# Patient Record
Sex: Female | Born: 1985 | Race: Asian | Hispanic: No | Marital: Married | Smoking: Never smoker
Health system: Southern US, Community
[De-identification: ages and names within clinical notes are randomized; demographics above are authoritative.]

## PROBLEM LIST (undated history)

## (undated) DIAGNOSIS — I10 Essential (primary) hypertension: Secondary | ICD-10-CM

## (undated) DIAGNOSIS — Z8632 Personal history of gestational diabetes: Secondary | ICD-10-CM

## (undated) DIAGNOSIS — Z8751 Personal history of pre-term labor: Secondary | ICD-10-CM

## (undated) DIAGNOSIS — O24419 Gestational diabetes mellitus in pregnancy, unspecified control: Secondary | ICD-10-CM

## (undated) HISTORY — DX: Personal history of pre-term labor: Z87.51

## (undated) HISTORY — DX: Personal history of gestational diabetes: Z86.32

---

## 2009-07-17 ENCOUNTER — Ambulatory Visit (HOSPITAL_COMMUNITY): Admission: RE | Admit: 2009-07-17 | Discharge: 2009-07-17 | Payer: Self-pay | Admitting: Obstetrics & Gynecology

## 2009-08-03 ENCOUNTER — Ambulatory Visit: Payer: Self-pay | Admitting: Obstetrics & Gynecology

## 2009-08-06 ENCOUNTER — Inpatient Hospital Stay (HOSPITAL_COMMUNITY): Admission: AD | Admit: 2009-08-06 | Discharge: 2009-08-08 | Payer: Self-pay | Admitting: Family Medicine

## 2009-08-06 ENCOUNTER — Ambulatory Visit: Payer: Self-pay | Admitting: Obstetrics and Gynecology

## 2010-11-18 LAB — CBC
HCT: 34.7 % — ABNORMAL LOW (ref 36.0–46.0)
MCHC: 30.8 g/dL (ref 30.0–36.0)
MCV: 75.7 fL — ABNORMAL LOW (ref 78.0–100.0)
Platelets: 166 10*3/uL (ref 150–400)
RDW: 14.3 % (ref 11.5–15.5)

## 2014-01-30 ENCOUNTER — Encounter (HOSPITAL_COMMUNITY): Payer: Self-pay | Admitting: Emergency Medicine

## 2014-01-30 ENCOUNTER — Emergency Department (INDEPENDENT_AMBULATORY_CARE_PROVIDER_SITE_OTHER)
Admission: EM | Admit: 2014-01-30 | Discharge: 2014-01-30 | Disposition: A | Discharge status: Home / Self Care | Payer: BC Managed Care – PPO | Source: Home / Self Care | Attending: Emergency Medicine | Admitting: Emergency Medicine

## 2014-01-30 DIAGNOSIS — H81399 Other peripheral vertigo, unspecified ear: Secondary | ICD-10-CM

## 2014-01-30 DIAGNOSIS — H6120 Impacted cerumen, unspecified ear: Secondary | ICD-10-CM

## 2014-01-30 DIAGNOSIS — J329 Chronic sinusitis, unspecified: Secondary | ICD-10-CM

## 2014-01-30 HISTORY — DX: Essential (primary) hypertension: I10

## 2014-01-30 LAB — POCT URINALYSIS DIP (DEVICE)
Bilirubin Urine: NEGATIVE
GLUCOSE, UA: NEGATIVE mg/dL
Hgb urine dipstick: NEGATIVE
Ketones, ur: NEGATIVE mg/dL
LEUKOCYTES UA: NEGATIVE
NITRITE: NEGATIVE
Protein, ur: NEGATIVE mg/dL
Specific Gravity, Urine: 1.025 (ref 1.005–1.030)
UROBILINOGEN UA: 0.2 mg/dL (ref 0.0–1.0)
pH: 6 (ref 5.0–8.0)

## 2014-01-30 LAB — POCT I-STAT, CHEM 8
BUN: 6 mg/dL (ref 6–23)
CALCIUM ION: 1.25 mmol/L — AB (ref 1.12–1.23)
CHLORIDE: 102 meq/L (ref 96–112)
Creatinine, Ser: 0.7 mg/dL (ref 0.50–1.10)
Glucose, Bld: 78 mg/dL (ref 70–99)
HEMATOCRIT: 44 % (ref 36.0–46.0)
HEMOGLOBIN: 15 g/dL (ref 12.0–15.0)
Potassium: 4.3 mEq/L (ref 3.7–5.3)
Sodium: 143 mEq/L (ref 137–147)
TCO2: 24 mmol/L (ref 0–100)

## 2014-01-30 LAB — POCT PREGNANCY, URINE: Preg Test, Ur: NEGATIVE

## 2014-01-30 MED ORDER — HYDROCORTISONE-ACETIC ACID 1-2 % OT SOLN: 3.0000 [drp] | OTIC | Status: DC

## 2014-01-30 MED ORDER — MECLIZINE HCL 25 MG PO TABS: 25.0000 mg | ORAL_TABLET | Freq: Four times a day (QID) | ORAL | Status: DC

## 2014-01-30 MED ORDER — DOCUSATE SODIUM 50 MG/5ML PO LIQD
ORAL | Status: AC
  Filled 2014-01-30: qty 10

## 2014-01-30 MED ORDER — AMOXICILLIN-POT CLAVULANATE 875-125 MG PO TABS: 1.0000 | ORAL_TABLET | Freq: Two times a day (BID) | ORAL | Status: DC

## 2014-01-30 NOTE — ED Notes (Signed)
Patient given Colace in both ears due to impacted cerumen.

## 2014-01-30 NOTE — Discharge Instructions (Signed)
Cerumen Plug A cerumen plug is having too much wax in your ear canal. The outer ear canal is lined with hairs and glands that secrete wax. This wax is called cerumen. This protects the ear canal. It also helps prevent material from entering the ear. Too much wax can cause a feeling of fullness in the ears, decreased hearing, ringing in the ears, or an earache. Sometimes your caregiver will remove a cerumen plug with an instrument called a curette. Or he/she may flush the ear canal with warm water from a syringe to remove the wax. You may simply be sent home to follow the home care instructions below for wax removal. Generally ear wax does not have to be removed unless it is causing a problem such as one of those listed above. When too much wax is causing a problem, the following are a few home remedies which can be used to help this problem. HOME CARE INSTRUCTIONS   Put a couple drops of glycerin, baby oil, or mineral oil in the ear a couple times of day. Do this every day for several days. After putting the drops in, you will need to lay with the affected ear pointing up for a couple minutes. This allows the drops to remain in the canal and run down to the area of wax blockage. This will soften the wax plug. It may also make your hearing worse as the wax softens and blocks the canal even more.  After a couple days, you may gently flush the ear canal with warm water from a syringe. Do this by pulling your ear up and back with your head tilted slightly forward and towards a pan to catch the water. This is most easily done with a helper. You can also accomplish the same thing by letting the shower beat into your ear canal to wash the wax out. Sometimes this will not be immediately successful. You will have to return to the first step of using the oil to further soften the wax. Then resume washing the ear canal out with a syringe or shower.  Following removal of the wax, put ten to twenty drops of rubbing  alcohol into the outer ears. This will dry the canal and prevent an infection.  Do not irrigate or wash out your ears if you have had a perforated ear drum or mastoid surgery. SEEK IMMEDIATE MEDICAL CARE IF:   You are unsuccessful with the above instructions for home care.  You develop ear pain or drainage from the ear. MAKE SURE YOU:   Understand these instructions.  Will watch your condition.  Will get help right away if you are not doing well or get worse. Document Released: 04/29/2001 Document Revised: 10/27/2011 Document Reviewed: 07/26/2008 [facility] Patient Information 2014 [place], [place].  Dizziness Dizziness is a common problem. It is a feeling of unsteadiness or lightheadedness. You may feel like you are about to faint. Dizziness can lead to injury if you stumble or fall. A person of any age group can suffer from dizziness, but dizziness is more common in older adults. CAUSES  Dizziness can be caused by many different things, including:  Middle ear problems.  Standing for too long.  Infections.  An allergic reaction.  Aging.  An emotional response to something, such as the sight of blood.  Side effects of medicines.  Fatigue.  Problems with circulation or blood pressure.  Excess use of alcohol, medicines, or illegal drug use.  Breathing too fast (hyperventilation).  An arrhythmia  or problems with your heart rhythm.  Low red blood cell count (anemia).  Pregnancy.  Vomiting, diarrhea, fever, or other illnesses that cause dehydration.  Diseases or conditions such as Parkinson's disease, high blood pressure (hypertension), diabetes, and thyroid problems.  Exposure to extreme heat. DIAGNOSIS  To find the cause of your dizziness, your caregiver may do a physical exam, lab tests, radiologic imaging scans, or an electrocardiography test (ECG).  TREATMENT  Treatment of dizziness depends on the cause of your symptoms and can vary greatly. HOME CARE  INSTRUCTIONS   Drink enough fluids to keep your urine clear or pale yellow. This is especially important in very hot weather. In the elderly, it is also important in cold weather.  If your dizziness is caused by medicines, take them exactly as directed. When taking blood pressure medicines, it is especially important to get up slowly.  Rise slowly from chairs and steady yourself until you feel okay.  In the morning, first sit up on the side of the bed. When this seems okay, stand slowly while holding onto something until you know your balance is fine.  If you need to stand in one place for a long time, be sure to move your legs often. Tighten and relax the muscles in your legs while standing.  If dizziness continues to be a problem, have someone stay with you for a day or two. Do this until you feel you are well enough to stay alone. Have the person call your caregiver if he or she notices changes in you that are concerning.  Do not drive or use heavy machinery if you feel dizzy.  Do not drink alcohol. SEEK IMMEDIATE MEDICAL CARE IF:   Your dizziness or lightheadedness gets worse.  You feel nauseous or vomit.  You develop problems with talking, walking, weakness, or using your arms, hands, or legs.  You are not thinking clearly or you have difficulty forming sentences. It may take a friend or family member to determine if your thinking is normal.  You develop chest pain, abdominal pain, shortness of breath, or sweating.  Your vision changes.  You notice any bleeding.  You have side effects from medicine that seems to be getting worse rather than better. MAKE SURE YOU:   Understand these instructions.  Will watch your condition.  Will get help right away if you are not doing well or get worse. Document Released: 01/28/2001 Document Revised: 10/27/2011 Document Reviewed: 02/21/2011 [facility] Patient Information 2014 [place], [place].  Benign Positional Vertigo Vertigo means  you feel like you or your surroundings are moving when they are not. Benign positional vertigo is the most common form of vertigo. Benign means that the cause of your condition is not serious. Benign positional vertigo is more common in older adults. CAUSES  Benign positional vertigo is the result of an upset in the labyrinth system. This is an area in the middle ear that helps control your balance. This may be caused by a viral infection, head injury, or repetitive motion. However, often no specific cause is found. SYMPTOMS  Symptoms of benign positional vertigo occur when you move your head or eyes in different directions. Some of the symptoms may include:  Loss of balance and falls.  Vomiting.  Blurred vision.  Dizziness.  Nausea.  Involuntary eye movements (nystagmus). DIAGNOSIS  Benign positional vertigo is usually diagnosed by physical exam. If the specific cause of your benign positional vertigo is unknown, your caregiver may perform imaging tests, such as magnetic  resonance imaging (MRI) or computed tomography (CT). TREATMENT  Your caregiver may recommend movements or procedures to correct the benign positional vertigo. Medicines such as meclizine, benzodiazepines, and medicines for nausea may be used to treat your symptoms. In rare cases, if your symptoms are caused by certain conditions that affect the inner ear, you may need surgery. HOME CARE INSTRUCTIONS   Follow your caregiver's instructions.  Move slowly. Do not make sudden body or head movements.  Avoid driving.  Avoid operating heavy machinery.  Avoid performing any tasks that would be dangerous to you or others during a vertigo episode.  Drink enough fluids to keep your urine clear or pale yellow. SEEK IMMEDIATE MEDICAL CARE IF:   You develop problems with walking, weakness, numbness, or using your arms, hands, or legs.  You have difficulty speaking.  You develop severe headaches.  Your nausea or vomiting  continues or gets worse.  You develop visual changes.  Your family or friends notice any behavioral changes.  Your condition gets worse.  You have a fever.  You develop a stiff neck or sensitivity to light. MAKE SURE YOU:   Understand these instructions.  Will watch your condition.  Will get help right away if you are not doing well or get worse. Document Released: 05/12/2006 Document Revised: 10/27/2011 Document Reviewed: 04/24/2011 [facility]Exit[facility] Patient Information 2014 [place], [place].

## 2014-01-30 NOTE — ED Provider Notes (Signed)
CSN: [id number]     Arrival date & time 01/30/14  1415 History   First MD Initiated Contact with Patient 01/30/14 1559     Chief Complaint  Patient presents with  . Dizziness   (Consider location/radiation/quality/duration/timing/severity/associated sxs/prior Treatment) HPI Comments: 28 year old female presents complaining of dizziness. She describes this as feeling like the world is spinning around her and like she is nauseous and has a headache. The feeling was bad this morning but it has gotten significantly better throughout the day. She had this once before in the past but was not near this bad. Her headache is located primarily on the left side in her forehead and cheekbones. She denies fever, chills home on vomiting, diarrhea, recent travel, sick contacts. Denies palpitations or feeling like she will faint. Denies any history of heart problems. She has never been seen for this problem the past  Patient is a 28 y.o. female presenting with dizziness.  Dizziness Associated symptoms: headaches and nausea   Associated symptoms: no chest pain, no diarrhea, no shortness of breath and no vomiting     Past Medical History  Diagnosis Date  . Hypertension    History reviewed. No pertinent past surgical history. No family history on file. History  Substance Use Topics  . Smoking status: Never Smoker   . Smokeless tobacco: Not on file  . Alcohol Use: No   OB History   Grav Para Term Preterm Abortions TAB SAB Ect Mult Living                 Review of Systems  Constitutional: Negative for fever and chills.  HENT: Positive for sinus pressure.   Eyes: Negative for photophobia and visual disturbance.  Respiratory: Negative for cough and shortness of breath.   Cardiovascular: Negative for chest pain and leg swelling.  Gastrointestinal: Positive for nausea. Negative for vomiting, abdominal pain and diarrhea.  Genitourinary: Negative for dysuria and urgency.  Musculoskeletal: Negative for  neck stiffness.  Skin: Negative for rash.  Neurological: Positive for dizziness and headaches. Negative for numbness.  Hematological: Negative for adenopathy.  All other systems reviewed and are negative.   Allergies  Review of patient's allergies indicates no known allergies.  Home Medications   Prior to Admission medications   Medication Sig Start Date End Date Taking? Authorizing Provider  acetic acid-hydrocortisone (VOSOL-HC) otic solution Place 3 drops into both ears every 4 (four) hours. 01/30/14   Graylon GoodZachary H Alphonso Gregson, PA-C  amoxicillin-clavulanate (AUGMENTIN) 875-125 MG per tablet Take 1 tablet by mouth every 12 (twelve) hours. 01/30/14   Graylon GoodZachary H Manolito Jurewicz, PA-C  meclizine (ANTIVERT) 25 MG tablet Take 1 tablet (25 mg total) by mouth 4 (four) times daily. 01/30/14   Adrian BlackwaterZachary H Delynda Sepulveda, PA-C   BP 135/85  Pulse 81  Temp(Src) 98 F (36.7 C) (Oral)  Resp 16  SpO2 100% Physical Exam  Nursing note and vitals reviewed. Constitutional: She is oriented to person, place, and time. Vital signs are normal. She appears well-developed and well-nourished. No distress.  HENT:  Head: Normocephalic and atraumatic.  Nose: Right sinus exhibits no maxillary sinus tenderness and no frontal sinus tenderness. Left sinus exhibits maxillary sinus tenderness and frontal sinus tenderness.  Mouth/Throat: Uvula is midline, oropharynx is clear and moist and mucous membranes are normal. No oropharyngeal exudate.  Left tonsil is enlarged and mildly erythematous. There is cerumen impaction bilaterally  Eyes: Conjunctivae are normal. Right eye exhibits no discharge. Left eye exhibits no discharge.  Neck: Normal range of motion.  Neck supple.  Cardiovascular: Normal rate, regular rhythm and normal heart sounds.  Exam reveals no gallop and no friction rub.   No murmur heard. Pulmonary/Chest: Effort normal and breath sounds normal. No respiratory distress. She has no wheezes. She has no rales.  Abdominal: Soft. There is no  tenderness.  Lymphadenopathy:    She has no cervical adenopathy.  Neurological: She is alert and oriented to person, place, and time. She has normal strength. Coordination normal.  Skin: Skin is warm and dry. No rash noted. She is not diaphoretic.  Psychiatric: She has a normal mood and affect. Judgment normal.    ED Course  Procedures (including critical care time) Labs Review Labs Reviewed  POCT I-STAT, CHEM 8 - Abnormal; Notable for the following:    Calcium, Ion 1.25 (*)    All other components within normal limits  POCT PREGNANCY, URINE  POCT URINALYSIS DIP (DEVICE)    Imaging Review No results found.  EKG shows possible right axis deviation, otherwise normal  Patient is very mildly orthostatic I-STAT is normal Urinalysis and urine pregnancy test are negative The right ear was able to be rinsed out, the tympanic membrane underlying was very erythematous, but the left could not be fully rinsed  MDM   1. Peripheral vertigo   2. Cerumen impaction   3. Sinusitis    I believe this patient has a feeling of vertigo due to a sinus infection and pressure in her ears. I will prescribe her and ear drops to use for one week and she will return, we will try to rinse out the cerumen again. Followup earlier if worsening   Meds ordered this encounter  Medications  . amoxicillin-clavulanate (AUGMENTIN) 875-125 MG per tablet    Sig: Take 1 tablet by mouth every 12 (twelve) hours.    Dispense:  14 tablet    Refill:  0    Order Specific Question:  Supervising Provider    Answer:  Linna HoffKINDL, JAMES D [phone][[id number]]  . meclizine (ANTIVERT) 25 MG tablet    Sig: Take 1 tablet (25 mg total) by mouth 4 (four) times daily.    Dispense:  28 tablet    Refill:  0    Order Specific Question:  Supervising Provider    Answer:  Linna HoffKINDL, JAMES D [phone][[id number]]  . acetic acid-hydrocortisone (VOSOL-HC) otic solution    Sig: Place 3 drops into both ears every 4 (four) hours.    Dispense:  10 mL    Refill:  1    Order  Specific Question:  Supervising Provider    Answer:  Bradd CanaryKINDL, JAMES D [[id number]]       Graylon GoodZachary H Sherrin Stahle, PA-C 01/30/14 1901

## 2014-01-30 NOTE — ED Notes (Signed)
Patient c/o dizziness onset this morning around 10 am. Has not eaten anything today. This has happened one time before. Patient reports hx of high blood pressure but no longer takes medication. Patient is alert and oriented and in no acute distress.

## 2014-01-31 NOTE — ED Provider Notes (Signed)
Medical screening examination/treatment/procedure(s) were performed by resident physician or non-physician practitioner and as supervising physician I was immediately available for consultation/collaboration.   Prestyn Stanco DOUGLAS MD.   [place] Want D Roney Youtz, MD 01/31/14 1716 

## 2014-02-13 ENCOUNTER — Emergency Department (HOSPITAL_COMMUNITY)
Admission: EM | Admit: 2014-02-13 | Discharge: 2014-02-13 | Disposition: A | Discharge status: Home / Self Care | Payer: BC Managed Care – PPO | Source: Home / Self Care | Attending: Family Medicine | Admitting: Family Medicine

## 2014-02-13 ENCOUNTER — Encounter (HOSPITAL_COMMUNITY): Payer: Self-pay | Admitting: Emergency Medicine

## 2014-02-13 DIAGNOSIS — H6120 Impacted cerumen, unspecified ear: Secondary | ICD-10-CM

## 2014-02-13 DIAGNOSIS — H6122 Impacted cerumen, left ear: Secondary | ICD-10-CM

## 2014-02-13 DIAGNOSIS — K120 Recurrent oral aphthae: Secondary | ICD-10-CM

## 2014-02-13 MED ORDER — TRIAMCINOLONE ACETONIDE 0.1 % MT PSTE: 1.0000 "application " | PASTE | Freq: Two times a day (BID) | OROMUCOSAL | Status: DC

## 2014-02-13 NOTE — Discharge Instructions (Signed)
Thank you for coming in today.   Cerumen Impaction A cerumen impaction is when the wax in your ear forms a plug. This plug usually causes reduced hearing. Sometimes it also causes an earache or dizziness. Removing a cerumen impaction can be difficult and painful. The wax sticks to the ear canal. The canal is sensitive and bleeds easily. If you try to remove a heavy wax buildup with a cotton tipped swab, you may push it in further. Irrigation with water, suction, and small ear curettes may be used to clear out the wax. If the impaction is fixed to the skin in the ear canal, ear drops may be needed for a few days to loosen the wax. People who build up a lot of wax frequently can use ear wax removal products available in your local drugstore. SEEK MEDICAL CARE IF:  You develop an earache, increased hearing loss, or marked dizziness. Document Released: 09/11/2004 Document Revised: 10/27/2011 Document Reviewed: 11/01/2009 [facility] SurgeryExit[facility] Patient Information 2015 [place]ExitCare, [place]. This information is not intended to replace advice given to you by your health care provider. Make sure you discuss any questions you have with your health care provider.  Canker Sores  Canker sores are painful, open sores on the inside of the mouth and cheek. They may be white or yellow. The sores usually heal in 1 to 2 weeks. Women are more likely than men to have recurrent canker sores. CAUSES The cause of canker sores is not well understood. More than one cause is likely. Canker sores do not appear to be caused by certain types of germs (viruses or bacteria). Canker sores may be caused by:  An allergic reaction to certain foods.  Digestive problems.  Not having enough vitamin B12, folic acid, and iron.  Female sex hormones. Sores may come only during certain phases of a menstrual cycle. Often, there is improvement during pregnancy.  Genetics. Some people seem to inherit canker sore problems. Emotional stress and injuries to  the mouth may trigger outbreaks, but not cause them.  DIAGNOSIS Canker sores are diagnosed by exam.  TREATMENT  Patients who have frequent bouts of canker sores may have cultures taken of the sores, blood tests, or allergy tests. This helps determine if their sores are caused by a poor diet, an allergy, or some other preventable or treatable disease.  Vitamins may prevent recurrences or reduce the severity of canker sores in people with poor nutrition.  Numbing ointments can relieve pain. These are available in drug stores without a prescription.  Anti-inflammatory steroid mouth rinses or gels may be prescribed by your caregiver for severe sores.  Oral steroids may be prescribed if you have severe, recurrent canker sores. These strong medicines can cause many side effects and should be used only under the close direction of a dentist or physician.  Mouth rinses containing the antibiotic medicine may be prescribed. They may lessen symptoms and speed healing. Healing usually happens in about 1 or 2 weeks with or without treatment. Certain antibiotic mouth rinses given to pregnant women and young children can permanently stain teeth. Talk to your caregiver about your treatment. HOME CARE INSTRUCTIONS   Avoid foods that cause canker sores for you.  Avoid citrus juices, spicy or salty foods, and coffee until the sores are healed.  Use a soft-bristled toothbrush.  Chew your food carefully to avoid biting your cheek.  Apply topical numbing medicine to the sore to help relieve pain.  Apply a thin paste of baking soda and water  to the sore to help heal the sore.  Only use mouth rinses or medicines for pain or discomfort as directed by your caregiver. SEEK MEDICAL CARE IF:   Your symptoms are not better in 1 week.  Your sores are still present after 2 weeks.  Your sores are very painful.  You have trouble breathing or swallowing.  Your sores come back frequently. Document Released:  11/29/2010 Document Revised: 11/29/2012 Document Reviewed: 11/29/2010 [facility]Exit[facility] Patient Information 2015 [place], [place]. This information is not intended to replace advice given to you by your health care provider. Make sure you discuss any questions you have with your health care provider.

## 2014-02-13 NOTE — ED Notes (Addendum)
Via sister, Estanislado SpireJarai interpreter... Pt c/o left ear pain onset 1 week Seen on 6/15 for similar sx Also c/o lesions inside mouth??? Alert w/no signs of acute distress.

## 2014-02-13 NOTE — ED Provider Notes (Signed)
Haley Griffin is a 28 y.o. female who presents to Urgent Care today for left ear pain. Patient is in one week history of left ear pain and pressure. She's frequent episodes of cerumen impaction. She denies any fevers or chills.  Additionally patient notes a sore on the inside of her upper left lip. This is been present for 3 days and is moderately painful. No fevers or chills nausea vomiting or diarrhea.   Past Medical History  Diagnosis Date  . Hypertension    History  Substance Use Topics  . Smoking status: Never Smoker   . Smokeless tobacco: Not on file  . Alcohol Use: No   ROS as above Medications: No current facility-administered medications for this encounter.   Current Outpatient Prescriptions  Medication Sig Dispense Refill  . acetic acid-hydrocortisone (VOSOL-HC) otic solution Place 3 drops into both ears every 4 (four) hours.  10 mL  1  . amoxicillin-clavulanate (AUGMENTIN) 875-125 MG per tablet Take 1 tablet by mouth every 12 (twelve) hours.  14 tablet  0  . meclizine (ANTIVERT) 25 MG tablet Take 1 tablet (25 mg total) by mouth 4 (four) times daily.  28 tablet  0    Exam:  BP 140/96  Pulse 88  Temp(Src) 98.4 F (36.9 C) (Oral)  Resp 18  SpO2 100% Gen: Well NAD HEENT: EOMI,  MMM left tympanic membranes occluded by cerumen. Right is normal. Erythematous area with central punched out lesion left upper left consistent with aphthous ulcer. Lungs: Normal work of breathing. CTABL Heart: RRR no MRG Abd: NABS, Soft. NT, ND Exts: Brisk capillary refill, warm and well perfused.   The cerumen was removed from the left ear. Patient felt much better. The ear canal and tympanic membrane were mildly erythematous without effusion.   No results found for this or any previous visit (from the past 24 hour(s)). No results found.  Assessment and Plan: 28 y.o. female with  1) left ear cerumen impaction. 2) canker sore. Treatment with triamcinolone in [place].   Discussed warning signs  or symptoms. Please see discharge instructions. Patient expresses understanding.    Rodolph BongEvan S Corey, MD 02/13/14 [phone]

## 2014-05-29 ENCOUNTER — Inpatient Hospital Stay (HOSPITAL_COMMUNITY)
Admission: AD | Admit: 2014-05-29 | Discharge: 2014-05-29 | Disposition: A | Discharge status: Home / Self Care | Payer: BC Managed Care – PPO | Source: Ambulatory Visit | Attending: Family Medicine | Admitting: Family Medicine

## 2016-11-03 DIAGNOSIS — Z3482 Encounter for supervision of other normal pregnancy, second trimester: Secondary | ICD-10-CM | POA: Diagnosis not present

## 2016-11-03 LAB — OB RESULTS CONSOLE HGB/HCT, BLOOD
HCT: 34 %
HEMOGLOBIN: 10.3 g/dL

## 2016-11-03 LAB — OB RESULTS CONSOLE GC/CHLAMYDIA
Chlamydia: NEGATIVE
GC PROBE AMP, GENITAL: NEGATIVE

## 2016-11-03 LAB — OB RESULTS CONSOLE HIV ANTIBODY (ROUTINE TESTING): HIV: NONREACTIVE

## 2016-11-03 LAB — OB RESULTS CONSOLE PLATELET COUNT: Platelets: 227 10*3/uL

## 2016-11-03 LAB — OB RESULTS CONSOLE ABO/RH
ABO/RH(D): A POS
RH Type: POSITIVE

## 2016-11-03 LAB — OB RESULTS CONSOLE VARICELLA ZOSTER ANTIBODY, IGG: VARICELLA IGG: IMMUNE

## 2016-11-03 LAB — OB RESULTS CONSOLE ANTIBODY SCREEN: ANTIBODY SCREEN: NEGATIVE

## 2016-11-03 LAB — OB RESULTS CONSOLE RPR: RPR: NONREACTIVE

## 2016-11-03 LAB — SICKLE CELL SCREEN: Sickle Cell Screen: NORMAL

## 2016-11-03 LAB — OB RESULTS CONSOLE HEPATITIS B SURFACE ANTIGEN: Hepatitis B Surface Ag: NEGATIVE

## 2016-11-07 ENCOUNTER — Other Ambulatory Visit (HOSPITAL_COMMUNITY): Payer: Self-pay | Admitting: Nurse Practitioner

## 2016-11-07 DIAGNOSIS — O09292 Supervision of pregnancy with other poor reproductive or obstetric history, second trimester: Secondary | ICD-10-CM

## 2016-11-10 ENCOUNTER — Encounter: Payer: Medicaid Other | Attending: Obstetrics & Gynecology | Admitting: *Deleted

## 2016-11-10 ENCOUNTER — Ambulatory Visit: Payer: Medicaid Other | Admitting: *Deleted

## 2016-11-10 ENCOUNTER — Encounter: Payer: Self-pay | Admitting: *Deleted

## 2016-11-10 DIAGNOSIS — Z713 Dietary counseling and surveillance: Secondary | ICD-10-CM | POA: Insufficient documentation

## 2016-11-10 DIAGNOSIS — O24419 Gestational diabetes mellitus in pregnancy, unspecified control: Secondary | ICD-10-CM | POA: Insufficient documentation

## 2016-11-10 MED ORDER — GLUCOSE BLOOD VI STRP: ORAL_STRIP | 12 refills | Status: DC

## 2016-11-10 MED ORDER — ACCU-CHEK GUIDE W/DEVICE KIT
1.0000 | PACK | Freq: Once | 0 refills | Status: AC
Start: 2016-11-10 — End: 2016-11-10

## 2016-11-10 MED ORDER — ACCU-CHEK FASTCLIX LANCETS MISC: 1.0000 | Freq: Four times a day (QID) | 8 refills | Status: DC

## 2016-11-10 NOTE — Progress Notes (Signed)
  Patient was seen on 11/10/2016 for Gestational Diabetes self-management. Live [place] interpretor here too. Patient states no previous history of GDM.  The following learning objectives were met by the patient :   States the definition of Gestational Diabetes  States why dietary management is important in controlling blood glucose  Describes the effects of carbohydrates on blood glucose levels  Demonstrates ability to create a balanced meal plan  Demonstrates carbohydrate counting   States when to check blood glucose levels  Demonstrates proper blood glucose monitoring techniques  States the effect of stress and exercise on blood glucose levels  States the importance of limiting caffeine and abstaining from alcohol and smoking  Plan:  Aim for 3 Carb Choices per meal (45 grams) +/- 1 either way  Aim for 1-2 Carbs per snack Begin reading food labels for Total Carbohydrate of foods Consider  increasing your activity level by walking or other activity daily as tolerated Begin checking BG before breakfast and 2 hours after first bite of breakfast, lunch and dinner as directed by MD  Take medication if directed by MD  Blood glucose monitor Rx called into pharmacy: Accu Chek Guide with Fast Clix drums Patient instructed to test pre breakfast and 2 hours each meal as directed by MD Bring Log Book to every medical appointment   Patient instructed to monitor glucose levels: FBS: 60 - <90 2 hour: <120  Patient received the following handouts: In [place], 2 dialects provided  Nutrition Diabetes and Pregnancy  Carbohydrate Counting List  Patient will be seen for follow-up as needed.

## 2016-11-13 ENCOUNTER — Encounter (HOSPITAL_COMMUNITY): Payer: Self-pay | Admitting: *Deleted

## 2016-11-14 ENCOUNTER — Other Ambulatory Visit (HOSPITAL_COMMUNITY): Payer: Self-pay | Admitting: Nurse Practitioner

## 2016-11-14 ENCOUNTER — Ambulatory Visit (HOSPITAL_COMMUNITY)
Admission: RE | Admit: 2016-11-14 | Discharge: 2016-11-14 | Disposition: A | Discharge status: Home / Self Care | Payer: Medicaid Other | Source: Ambulatory Visit | Attending: Nurse Practitioner | Admitting: Nurse Practitioner

## 2016-11-14 ENCOUNTER — Encounter (HOSPITAL_COMMUNITY): Payer: Self-pay

## 2016-11-14 DIAGNOSIS — Z3A21 21 weeks gestation of pregnancy: Secondary | ICD-10-CM

## 2016-11-14 DIAGNOSIS — O09292 Supervision of pregnancy with other poor reproductive or obstetric history, second trimester: Secondary | ICD-10-CM

## 2016-11-14 DIAGNOSIS — Z363 Encounter for antenatal screening for malformations: Secondary | ICD-10-CM

## 2016-11-14 DIAGNOSIS — O10012 Pre-existing essential hypertension complicating pregnancy, second trimester: Secondary | ICD-10-CM | POA: Insufficient documentation

## 2016-11-14 DIAGNOSIS — O30042 Twin pregnancy, dichorionic/diamniotic, second trimester: Secondary | ICD-10-CM | POA: Diagnosis present

## 2016-11-14 DIAGNOSIS — O24419 Gestational diabetes mellitus in pregnancy, unspecified control: Secondary | ICD-10-CM | POA: Insufficient documentation

## 2016-11-14 HISTORY — DX: Gestational diabetes mellitus in pregnancy, unspecified control: O24.419

## 2016-11-19 ENCOUNTER — Ambulatory Visit (INDEPENDENT_AMBULATORY_CARE_PROVIDER_SITE_OTHER): Payer: Medicaid Other | Admitting: Obstetrics and Gynecology

## 2016-11-19 ENCOUNTER — Other Ambulatory Visit (HOSPITAL_COMMUNITY): Payer: Self-pay | Admitting: *Deleted

## 2016-11-19 ENCOUNTER — Encounter: Payer: Self-pay | Admitting: Obstetrics and Gynecology

## 2016-11-19 VITALS — BP 119/81 | HR 103 | Ht 59.5 in | Wt 144.3 lb

## 2016-11-19 DIAGNOSIS — Z8632 Personal history of gestational diabetes: Secondary | ICD-10-CM | POA: Diagnosis not present

## 2016-11-19 DIAGNOSIS — O0990 Supervision of high risk pregnancy, unspecified, unspecified trimester: Secondary | ICD-10-CM

## 2016-11-19 DIAGNOSIS — O30049 Twin pregnancy, dichorionic/diamniotic, unspecified trimester: Secondary | ICD-10-CM

## 2016-11-19 DIAGNOSIS — O09292 Supervision of pregnancy with other poor reproductive or obstetric history, second trimester: Secondary | ICD-10-CM | POA: Diagnosis not present

## 2016-11-19 DIAGNOSIS — O30042 Twin pregnancy, dichorionic/diamniotic, second trimester: Secondary | ICD-10-CM

## 2016-11-19 DIAGNOSIS — O0992 Supervision of high risk pregnancy, unspecified, second trimester: Secondary | ICD-10-CM | POA: Diagnosis not present

## 2016-11-19 DIAGNOSIS — Z8679 Personal history of other diseases of the circulatory system: Secondary | ICD-10-CM | POA: Diagnosis not present

## 2016-11-19 HISTORY — DX: Personal history of gestational diabetes: Z86.32

## 2016-11-19 LAB — POCT URINALYSIS DIP (DEVICE)
BILIRUBIN URINE: NEGATIVE
Glucose, UA: NEGATIVE mg/dL
Hgb urine dipstick: NEGATIVE
KETONES UR: 15 mg/dL — AB
NITRITE: NEGATIVE
Protein, ur: NEGATIVE mg/dL
Specific Gravity, Urine: 1.015 (ref 1.005–1.030)
Urobilinogen, UA: 0.2 mg/dL (ref 0.0–1.0)
pH: 7 (ref 5.0–8.0)

## 2016-11-19 MED ORDER — ASPIRIN EC 81 MG PO TBEC: 81.0000 mg | DELAYED_RELEASE_TABLET | Freq: Every day | ORAL | 3 refills | Status: DC

## 2016-11-19 NOTE — Addendum Note (Signed)
Addended by: Garret Reddish on: 11/19/2016 03:10 PM   Modules accepted: Orders

## 2016-11-19 NOTE — Patient Instructions (Signed)
Multiple Pregnancy Having a multiple pregnancy means that a woman is carrying more than one baby at a time. She may be pregnant with twins, triplets, or more. The majority of multiple pregnancies are twins. Naturally conceiving triplets or more (higher-order multiples) is rare. Multiple pregnancies are riskier than single pregnancies. A woman with a multiple pregnancy is more likely to have certain problems during her pregnancy. Therefore, she will need to have more frequent appointments for prenatal care. How does a multiple pregnancy happen? A multiple pregnancy happens when:  The woman's body releases more than one egg at a time, and then each egg gets fertilized by a different sperm.  This is the most common type of multiple pregnancy.  Twins or other multiples produced this way are fraternal. They are no more alike than non-multiple siblings are.  One sperm fertilizes one egg, which then divides into more than one embryo.  Twins or other multiples produced this way are identical. Identical multiples are always the same gender, and they look very much alike. Who is most likely to have a multiple pregnancy? A multiple pregnancy is more likely to develop in women who:  Have had fertility treatment, especially if the treatment included fertility drugs.  Are older than 32 years of age.  Have already had four or more children.  Have a family history of multiple pregnancy. How is a multiple pregnancy diagnosed? A multiple pregnancy may be diagnosed based on:  Symptoms such as:  Rapid weight gain in the first 3 months of pregnancy (first trimester).  More severe nausea and breast tenderness than what is typical of a single pregnancy.  The uterus measuring larger than what is normal for the stage of the pregnancy.  Blood tests that detect a higher-than-normal level of human chorionic gonadotropin (hCG). This is a hormone that your body produces in early pregnancy.  Ultrasound exam.  This is used to confirm that you are carrying multiples. What risks are associated with multiple pregnancy? A multiple pregnancy puts you at a higher risk for certain problems during or after your pregnancy, including:  Having your babies delivered before you have reached a full-term pregnancy (preterm birth). A full-term pregnancy lasts for at least 37 weeks. Babies born before 37 weeks may have a higher risk of a variety of health problems, such as breathing problems, feeding difficulties, cerebral palsy, and learning disabilities.  Diabetes.  Preeclampsia. This is a serious condition that causes high blood pressure along with other symptoms, such as swelling and headaches, during pregnancy.  Excessive blood loss after childbirth (postpartum hemorrhage).  Postpartum depression.  Low birth weight of the babies. How will having a multiple pregnancy affect my care? Your health care provider will want to monitor you more closely during your pregnancy to make sure that your babies are growing normally and that you are healthy. Follow these instructions at home: Because your pregnancy is considered to be high risk, you will need to work closely with your health care team. You may also need to make some lifestyle changes. These may include the following: Eating and drinking   Increase your nutrition.  Follow your health care provider's recommendations for weight gain. You may need to gain a little extra weight when you are pregnant with multiples.  Eat healthy snacks often throughout the day. This can add calories and reduce nausea.  Drink enough fluid to keep your urine clear or pale yellow.  Take prenatal vitamins. Activity  By 20-24 weeks, you may need to   limit your activities.  Avoid activities and work that take a lot of effort (are strenuous).  Ask your health care provider when you should stop having sexual intercourse.  Rest often. General instructions   Do not use any  products that contain nicotine or tobacco, such as cigarettes and e-cigarettes. If you need help quitting, ask your health care provider.  Do not drink alcohol or use illegal drugs.  Take over-the-counter and prescription medicines only as told by your health care provider.  Arrange for extra help around the house.  Keep all follow-up visits and all prenatal visits as told by your health care provider. This is important. Contact a health care provider if:  You have dizziness.  You have persistent nausea, vomiting, or diarrhea.  You are having trouble gaining weight.  You have feelings of depression or other emotions that are interfering with your normal activities. Get help right away if:  You have a fever.  You have pain with urination.  You have fluid leaking from your vagina.  You have a bad-smelling vaginal discharge.  You notice increased swelling in your face, hands, legs, or ankles.  You have spotting or bleeding from your vagina.  You have pelvic cramps, pelvic pressure, or nagging pain in your abdomen or lower back.  You are having regular contractions.  You develop a severe headache, with or without visual changes.  You have shortness of breath or chest pain.  You notice less fetal movement, or no fetal movement. This information is not intended to replace advice given to you by your health care provider. Make sure you discuss any questions you have with your health care provider. Document Released: 05/13/2008 Document Revised: 04/04/2016 Document Reviewed: 04/04/2016 Elsevier Interactive Patient Education  2017 Elsevier Inc.  

## 2016-11-19 NOTE — Progress Notes (Signed)
   PRENATAL VISIT NOTE  Subjective:  Haley Griffin is a 31 y.o. G3P2002 at 64w2dbeing seen today [facility] is currently monitored for the following issues for this high-risk pregnancy and has Twin gestation, dichorionic/diamniotic (two placentae, two amniotic sacs) and H/O gestational diabetes in prior pregnancy, currently pregnant, second trimester on her problem list.  Patient reports no complaints.  Contractions: Not present. Vag. Bleeding: None.  Movement: Present. Denies leaking of fluid.   The following portions of the patient's history were reviewed and updated as appropriate: allergies, current medications, past family history, past medical history, past social history, past surgical history and problem list. Problem list updated.  Objective:   Vitals:   11/19/16 1306 11/19/16 1311  BP: 119/81   Pulse: (!) 103   Weight: 144 lb 4.8 oz (65.5 kg)   Height:  4' 11.5" (1.511 m)    Fetal Status: Fetal Heart Rate (bpm): 159/156   Movement: Present     General:  Alert, oriented and cooperative. Patient is in no acute distress.  Skin: Skin is warm and dry. No rash noted.   Cardiovascular: Normal heart rate noted  Respiratory: Normal respiratory effort, no problems with respiration noted  Abdomen: Soft, gravid, appropriate for gestational age. Pain/Pressure: Present     Pelvic:  Cervical exam deferred        Extremities: Normal range of motion.  Edema: None  Mental Status: Normal mood and affect. Normal behavior. Normal judgment and thought content.   Assessment and Plan:  Pregnancy: G3P2002 at 241w2d  ICD-9-CM ICD-10-CM   1. Twin pregnancy, dichorionic/diamniotic, second trimester 651.03 O30.042    V91.03    2. History of hypertension V12.59 Z86.79 Protein, urine, 24 hour     Comp Met (CMET)     TSH  3. H/O gestational diabetes in prior pregnancy, currently pregnant, second trimester V23.49 O09.292     Z86.32    Continue CBG checks as  previously advised, but may check bid, alternating times. Reassess next visit  Will get baseline HTN labs  Preterm labor symptoms and general obstetric precautions including but not limited to vaginal bleeding, contractions, leaking of fluid and fetal movement were reviewed in detail with the patient. Please refer to After Visit Summary for other counseling recommendations.  Return in about 3 weeks (around 12/10/2016).   DeLorene DyCNM

## 2016-11-19 NOTE — Progress Notes (Signed)
[place] interpreter present for visit

## 2016-11-20 LAB — COMPREHENSIVE METABOLIC PANEL
A/G RATIO: 1.4 (ref 1.2–2.2)
ALK PHOS: 67 IU/L (ref 39–117)
ALT: 12 IU/L (ref 0–32)
AST: 15 IU/L (ref 0–40)
Albumin: 3.8 g/dL (ref 3.5–5.5)
BILIRUBIN TOTAL: 0.4 mg/dL (ref 0.0–1.2)
BUN/Creatinine Ratio: 15 (ref 9–23)
BUN: 7 mg/dL (ref 6–20)
CHLORIDE: 99 mmol/L (ref 96–106)
CO2: 20 mmol/L (ref 18–29)
Calcium: 9.3 mg/dL (ref 8.7–10.2)
Creatinine, Ser: 0.47 mg/dL — ABNORMAL LOW (ref 0.57–1.00)
GFR calc Af Amer: 151 mL/min/{1.73_m2} (ref >59)
GFR calc non Af Amer: 131 mL/min/{1.73_m2} (ref >59)
GLUCOSE: 58 mg/dL — AB (ref 65–99)
Globulin, Total: 2.8 g/dL (ref 1.5–4.5)
POTASSIUM: 4 mmol/L (ref 3.5–5.2)
Sodium: 135 mmol/L (ref 134–144)
TOTAL PROTEIN: 6.6 g/dL (ref 6.0–8.5)

## 2016-11-20 LAB — TSH: TSH: 0.67 u[IU]/mL (ref 0.450–4.500)

## 2016-11-22 LAB — PROTEIN, URINE, 24 HOUR
PROTEIN 24H UR: 78 mg/(24.h) (ref 30–150)
Protein, Ur: 10.4 mg/dL

## 2016-11-30 DIAGNOSIS — O30049 Twin pregnancy, dichorionic/diamniotic, unspecified trimester: Secondary | ICD-10-CM | POA: Insufficient documentation

## 2016-12-09 ENCOUNTER — Ambulatory Visit (INDEPENDENT_AMBULATORY_CARE_PROVIDER_SITE_OTHER): Payer: Medicaid Other | Admitting: Obstetrics and Gynecology

## 2016-12-09 VITALS — BP 125/88 | HR 108 | Wt 142.4 lb

## 2016-12-09 DIAGNOSIS — O30042 Twin pregnancy, dichorionic/diamniotic, second trimester: Secondary | ICD-10-CM

## 2016-12-09 DIAGNOSIS — O09292 Supervision of pregnancy with other poor reproductive or obstetric history, second trimester: Secondary | ICD-10-CM | POA: Diagnosis not present

## 2016-12-09 DIAGNOSIS — Z8679 Personal history of other diseases of the circulatory system: Secondary | ICD-10-CM | POA: Diagnosis not present

## 2016-12-09 DIAGNOSIS — Z8632 Personal history of gestational diabetes: Secondary | ICD-10-CM | POA: Diagnosis not present

## 2016-12-09 DIAGNOSIS — O0992 Supervision of high risk pregnancy, unspecified, second trimester: Secondary | ICD-10-CM

## 2016-12-09 DIAGNOSIS — O0990 Supervision of high risk pregnancy, unspecified, unspecified trimester: Secondary | ICD-10-CM

## 2016-12-09 DIAGNOSIS — O30049 Twin pregnancy, dichorionic/diamniotic, unspecified trimester: Secondary | ICD-10-CM

## 2016-12-09 NOTE — Patient Instructions (Signed)
Diabetes Mellitus and Food It is important for you to manage your blood sugar (glucose) level. Your blood glucose level can be greatly affected by what you eat. Eating healthier foods in the appropriate amounts throughout the day at about the same time each day will help you control your blood glucose level. It can also help slow or prevent worsening of your diabetes mellitus. Healthy eating may even help you improve the level of your blood pressure and reach or maintain a healthy weight. General recommendations for healthful eating and cooking habits include:  Eating meals and snacks regularly. Avoid going long periods of time without eating to lose weight.  Eating a diet that consists mainly of plant-based foods, such as fruits, vegetables, nuts, legumes, and whole grains.  Using low-heat cooking methods, such as baking, instead of high-heat cooking methods, such as deep frying.  Work with your dietitian to make sure you understand how to use the Nutrition Facts information on food labels. How can food affect me? Carbohydrates Carbohydrates affect your blood glucose level more than any other type of food. Your dietitian will help you determine how many carbohydrates to eat at each meal and teach you how to count carbohydrates. Counting carbohydrates is important to keep your blood glucose at a healthy level, especially if you are using insulin or taking certain medicines for diabetes mellitus. Alcohol Alcohol can cause sudden decreases in blood glucose (hypoglycemia), especially if you use insulin or take certain medicines for diabetes mellitus. Hypoglycemia can be a life-threatening condition. Symptoms of hypoglycemia (sleepiness, dizziness, and disorientation) are similar to symptoms of having too much alcohol. If your health care provider has given you approval to drink alcohol, do so in moderation and use the following guidelines:  Women should not have more than one drink per day, and men  should not have more than two drinks per day. One drink is equal to: ? 12 oz of beer. ? 5 oz of wine. ? 1 oz of hard liquor.  Do not drink on an empty stomach.  Keep yourself hydrated. Have water, diet soda, or unsweetened iced tea.  Regular soda, juice, and other mixers might contain a lot of carbohydrates and should be counted.  What foods are not recommended? As you make food choices, it is important to remember that all foods are not the same. Some foods have fewer nutrients per serving than other foods, even though they might have the same number of calories or carbohydrates. It is difficult to get your body what it needs when you eat foods with fewer nutrients. Examples of foods that you should avoid that are high in calories and carbohydrates but low in nutrients include:  Trans fats (most processed foods list trans fats on the Nutrition Facts label).  Regular soda.  Juice.  Candy.  Sweets, such as cake, pie, doughnuts, and cookies.  Fried foods.  What foods can I eat? Eat nutrient-rich foods, which will nourish your body and keep you healthy. The food you should eat also will depend on several factors, including:  The calories you need.  The medicines you take.  Your weight.  Your blood glucose level.  Your blood pressure level.  Your cholesterol level.  You should eat a variety of foods, including:  Protein. ? Lean cuts of meat. ? Proteins low in saturated fats, such as fish, egg whites, and beans. Avoid processed meats.  Fruits and vegetables. ? Fruits and vegetables that may help control blood glucose levels, such as apples,   mangoes, and yams.  Dairy products. ? Choose fat-free or low-fat dairy products, such as milk, yogurt, and cheese.  Grains, bread, pasta, and rice. ? Choose whole grain products, such as multigrain bread, whole oats, and brown rice. These foods may help control blood pressure.  Fats. ? Foods containing healthful fats, such as  nuts, avocado, olive oil, canola oil, and fish.  Does everyone with diabetes mellitus have the same meal plan? Because every person with diabetes mellitus is different, there is not one meal plan that works for everyone. It is very important that you meet with a dietitian who will help you create a meal plan that is just right for you. This information is not intended to replace advice given to you by your health care provider. Make sure you discuss any questions you have with your health care provider. Document Released: 05/01/2005 Document Revised: 01/10/2016 Document Reviewed: 07/01/2013 Elsevier Interactive Patient Education  2017 Elsevier Inc.  

## 2016-12-09 NOTE — Progress Notes (Signed)
   PRENATAL VISIT NOTE  Subjective:  Haley Griffin is a 31 y.o. G3P2002 at [redacted]w[redacted]d being seen today for ongoing prenatal care.  She is currently monitored for the following issues for this high-risk pregnancy and has Supervision of high risk pregnancy, antepartum; H/O gestational diabetes in prior pregnancy, currently pregnant, second trimester; History of chronic hypertension; and Dichorionic diamniotic twin pregnancy, antepartum on her problem list.  Patient reports no complaints.  Contractions: Not present. Vag. Bleeding: None.  Movement: Present. Denies leaking of fluid.   The following portions of the patient's history were reviewed and updated as appropriate: allergies, current medications, past family history, past medical history, past social history, past surgical history and problem list. Problem list updated.  Objective:   Vitals:   12/09/16 1031  BP: 125/88  Pulse: (!) 108  Weight: 142 lb 6.4 oz (64.6 kg)    Fetal Status: Fetal Heart Rate (bpm): 147/152   Movement: Present     General:  Alert, oriented and cooperative. Patient is in no acute distress.  Skin: Skin is warm and dry. No rash noted.   Cardiovascular: Normal heart rate noted  Respiratory: Normal respiratory effort, no problems with respiration noted  Abdomen: Soft, gravid, appropriate for gestational age. Pain/Pressure: Present     Pelvic:  Cervical exam deferred        Extremities: Normal range of motion.  Edema: None  Mental Status: Normal mood and affect. Normal behavior. Normal judgment and thought content.   Assessment and Plan:  Pregnancy: G3P2002 at [redacted]w[redacted]d  1. Supervision of high risk pregnancy, antepartum - continue prenatal care  2. GDMA1 -sugars today are good except for after dinner. Fasting are all less than 95. Post prandial breackfast and lunch are less than 120. 25% of post prandial dinner are between 120-130. D/W patient dietary habits and advised less carbs at dinner. Avoid soft drinks.  -advised  to take sugars BID. Always take after dinner and alternate after other meals and in the AM.  3. History of chronic hypertension BP controlled. Baseline labs are normal.  Patient had not been taking baby ASA. Ordered today.  4. Dichorionic diamniotic twin pregnancy, antepartum FHTs good today. [place] as scheduled.  Preterm labor symptoms and general obstetric precautions including but not limited to vaginal bleeding, contractions, leaking of fluid and fetal movement were reviewed in detail with the patient. Please refer to After Visit Summary for other counseling recommendations.  Return in about 4 weeks (around 01/06/2017) for HROB.   Lorne Skeens, MD

## 2016-12-16 ENCOUNTER — Ambulatory Visit (HOSPITAL_COMMUNITY)
Admission: RE | Admit: 2016-12-16 | Discharge: 2016-12-16 | Disposition: A | Discharge status: Home / Self Care | Payer: Medicaid Other | Source: Ambulatory Visit | Attending: Nurse Practitioner | Admitting: Nurse Practitioner

## 2016-12-16 ENCOUNTER — Inpatient Hospital Stay (HOSPITAL_COMMUNITY)
Admission: AD | Admit: 2016-12-16 | Discharge: 2016-12-22 | DRG: 765 | Disposition: A | Discharge status: Home / Self Care | Payer: Medicaid Other | Source: Ambulatory Visit | Attending: Obstetrics and Gynecology | Admitting: Obstetrics and Gynecology

## 2016-12-16 ENCOUNTER — Encounter (HOSPITAL_COMMUNITY): Payer: Self-pay

## 2016-12-16 ENCOUNTER — Other Ambulatory Visit: Payer: Self-pay | Admitting: Obstetrics and Gynecology

## 2016-12-16 DIAGNOSIS — O24420 Gestational diabetes mellitus in childbirth, diet controlled: Secondary | ICD-10-CM | POA: Diagnosis present

## 2016-12-16 DIAGNOSIS — O321XX1 Maternal care for breech presentation, fetus 1: Secondary | ICD-10-CM | POA: Diagnosis present

## 2016-12-16 DIAGNOSIS — O321XX2 Maternal care for breech presentation, fetus 2: Secondary | ICD-10-CM | POA: Diagnosis present

## 2016-12-16 DIAGNOSIS — Z8751 Personal history of pre-term labor: Secondary | ICD-10-CM

## 2016-12-16 DIAGNOSIS — O30042 Twin pregnancy, dichorionic/diamniotic, second trimester: Secondary | ICD-10-CM

## 2016-12-16 DIAGNOSIS — O10912 Unspecified pre-existing hypertension complicating pregnancy, second trimester: Secondary | ICD-10-CM | POA: Diagnosis not present

## 2016-12-16 DIAGNOSIS — O1002 Pre-existing essential hypertension complicating childbirth: Secondary | ICD-10-CM | POA: Diagnosis present

## 2016-12-16 DIAGNOSIS — O24410 Gestational diabetes mellitus in pregnancy, diet controlled: Secondary | ICD-10-CM

## 2016-12-16 DIAGNOSIS — Z3A25 25 weeks gestation of pregnancy: Secondary | ICD-10-CM

## 2016-12-16 DIAGNOSIS — O42919 Preterm premature rupture of membranes, unspecified as to length of time between rupture and onset of labor, unspecified trimester: Secondary | ICD-10-CM

## 2016-12-16 DIAGNOSIS — O0990 Supervision of high risk pregnancy, unspecified, unspecified trimester: Secondary | ICD-10-CM

## 2016-12-16 DIAGNOSIS — O42912 Preterm premature rupture of membranes, unspecified as to length of time between rupture and onset of labor, second trimester: Secondary | ICD-10-CM | POA: Diagnosis present

## 2016-12-16 DIAGNOSIS — Z8679 Personal history of other diseases of the circulatory system: Secondary | ICD-10-CM

## 2016-12-16 DIAGNOSIS — O30049 Twin pregnancy, dichorionic/diamniotic, unspecified trimester: Secondary | ICD-10-CM | POA: Diagnosis not present

## 2016-12-16 DIAGNOSIS — O42112 Preterm premature rupture of membranes, onset of labor more than 24 hours following rupture, second trimester: Secondary | ICD-10-CM | POA: Diagnosis not present

## 2016-12-16 HISTORY — DX: Personal history of pre-term labor: Z87.51

## 2016-12-16 LAB — TYPE AND SCREEN
ABO/RH(D): A POS
Antibody Screen: NEGATIVE

## 2016-12-16 MED ORDER — AMPICILLIN SODIUM 500 MG IJ SOLR: 2.0000 g | Freq: Four times a day (QID) | INTRAMUSCULAR | Status: DC

## 2016-12-16 MED ORDER — CALCIUM CARBONATE ANTACID 500 MG PO CHEW: 2.0000 | CHEWABLE_TABLET | ORAL | Status: DC | PRN

## 2016-12-16 MED ORDER — BETAMETHASONE SOD PHOS & ACET 6 (3-3) MG/ML IJ SUSP: 12.0000 mg | INTRAMUSCULAR | Status: DC

## 2016-12-16 MED ORDER — DOCUSATE SODIUM 100 MG PO CAPS
100.0000 mg | ORAL_CAPSULE | Freq: Every day | ORAL | Status: DC
  Administered 2016-12-17 – 2016-12-18 (×2): 100 mg via ORAL
  Filled 2016-12-16 (×3): qty 1

## 2016-12-16 MED ORDER — ACETAMINOPHEN 325 MG PO TABS: 650.0000 mg | ORAL_TABLET | ORAL | Status: DC | PRN

## 2016-12-16 MED ORDER — AZITHROMYCIN 250 MG PO TABS
1000.0000 mg | ORAL_TABLET | Freq: Once | ORAL | Status: AC
  Administered 2016-12-16: 1000 mg via ORAL
  Filled 2016-12-16: qty 4

## 2016-12-16 MED ORDER — PRENATAL MULTIVITAMIN CH: 1.0000 | ORAL_TABLET | Freq: Every day | ORAL | Status: DC

## 2016-12-16 MED ORDER — AMOXICILLIN 500 MG PO CAPS
500.0000 mg | ORAL_CAPSULE | Freq: Three times a day (TID) | ORAL | Status: DC
  Administered 2016-12-18 – 2016-12-19 (×2): 500 mg via ORAL
  Filled 2016-12-16 (×2): qty 1

## 2016-12-16 MED ORDER — CALCIUM CARBONATE ANTACID 500 MG PO CHEW
2.0000 | CHEWABLE_TABLET | ORAL | Status: DC | PRN
Start: 2016-12-16 — End: 2016-12-22

## 2016-12-16 MED ORDER — SODIUM CHLORIDE 0.9 % IV SOLN
2.0000 g | Freq: Four times a day (QID) | INTRAVENOUS | Status: AC
  Administered 2016-12-16 – 2016-12-18 (×8): 2 g via INTRAVENOUS
  Filled 2016-12-16 (×8): qty 2000

## 2016-12-16 MED ORDER — DOCUSATE SODIUM 50 MG PO CAPS: 100.0000 mg | ORAL_CAPSULE | Freq: Every day | ORAL | Status: DC

## 2016-12-16 MED ORDER — ACETAMINOPHEN 325 MG PO TABS
650.0000 mg | ORAL_TABLET | ORAL | Status: DC | PRN
  Administered 2016-12-19: 650 mg via ORAL
  Filled 2016-12-16: qty 2

## 2016-12-16 MED ORDER — PRENATAL MULTIVITAMIN CH
1.0000 | ORAL_TABLET | Freq: Every day | ORAL | Status: DC
  Administered 2016-12-17 – 2016-12-21 (×4): 1 via ORAL
  Filled 2016-12-16 (×5): qty 1

## 2016-12-16 MED ORDER — AMOXICILLIN 250 MG PO CAPS: 500.0000 mg | ORAL_CAPSULE | Freq: Three times a day (TID) | ORAL | Status: DC

## 2016-12-16 MED ORDER — BETAMETHASONE SOD PHOS & ACET 6 (3-3) MG/ML IJ SUSP
12.0000 mg | INTRAMUSCULAR | Status: AC
  Administered 2016-12-16 – 2016-12-17 (×2): 12 mg via INTRAMUSCULAR
  Filled 2016-12-16 (×2): qty 2

## 2016-12-16 MED ORDER — AZITHROMYCIN 250 MG PO TABS: 1000.0000 mg | ORAL_TABLET | Freq: Once | ORAL | Status: DC

## 2016-12-16 NOTE — ED Notes (Signed)
Called report to MAU to notify pt is coming to rule out PROM.  Pt reports she has been having some white discharge.  When entering room for U/S today approx 1600, she told the sonographer her pants got wet.  Dr. Sherrie George made aware.  Pt had U/S and then taken to MAU for further evaluation.

## 2016-12-16 NOTE — H&P (Signed)
Haley Griffin is a 31 y.o. female G3P2002 at 13 weeks with Di-Di twins presenting for inpatient management of PPROM. Patient presented to MFM office for routine fetal growth scan and reported leakage of fluid upon arrival. PPROM was confirmed by the presence of oligohydramnios around baby A and pooling of clear fluid on exam table. Patient denies cramping or contractions and reports good fetal movement. Patient had prenatal care at [facility] complivated by Di-Di twin pregnancy, GDMA1 and CHTN on no medications.   OB History    Gravida Para Term Preterm AB Living   SAB TAB Ectopic Multiple Live Births           2     Past Medical History:  Diagnosis Date  . Gestational diabetes   . Hypertension    Past Surgical History:  Procedure Laterality Date  . NO PAST SURGERIES     Family History: family history includes Diabetes in her mother. Social History:  reports that she has never smoked. She has never used smokeless tobacco. She reports that she does not drink alcohol or use drugs.     Maternal Diabetes: Yes:  Diabetes Type:  Diet controlled Genetic Screening: Normal Maternal Ultrasounds/Referrals: Normal Fetal Ultrasounds or other Referrals:  None Maternal Substance Abuse:  No Significant Maternal Medications:  None Significant Maternal Lab Results:  None Other Comments:  None  ROS  See pertinent in HPI History   Blood pressure (!) 102/56, pulse (!) 102, resp. rate 16, last menstrual period 06/16/2016, SpO2 97 %. Exam Physical Exam  GENERAL: Well-developed, well-nourished female in no acute distress.  LUNGS: Clear to auscultation bilaterally.  HEART: Regular rate and rhythm. ABDOMEN: Soft, nontender, gravid PELVIC: SSE: pooling of clear fluid. Cervix 2 cm dilated visually EXTREMITIES: No cyanosis, clubbing, or edema, 2+ distal pulses.  Prenatal labs: ABO, Rh: A/Positive/A positive (03/19 0000) Antibody: Negative (03/19 0000) Rubella:   RPR: Nonreactive (03/19 0000)   HBsAg:    HIV:    GBS:     Assessment/Plan: 31 yo G3P2002 at 25w with di-di twin here for PPROM of baby A - Admit for inpatient care - Latency antibiotic - BMZ - NICU consult - Monitor for sign/symptoms of chorio - Monitor CBG and initiate glycemic agent if needed given administration of BMZ - Monitor BP - Routine antepartum care  Haley Griffin 12/16/2016, 6:28 PM

## 2016-12-16 NOTE — MAU Note (Signed)
Patient brought to mau from MFM for evaluation of rupture of membranes. Patient states 30 minutes had a gush of clear fluid when she went to the bathroom. REports lower abdominal cramping 4 out of 10 that started 2-3 days ago. Denies vaginal bleeding. +FM

## 2016-12-17 LAB — CBC
HCT: 30.6 % — ABNORMAL LOW (ref 36.0–46.0)
Hemoglobin: 9.5 g/dL — ABNORMAL LOW (ref 12.0–15.0)
MCH: 22.6 pg — AB (ref 26.0–34.0)
MCHC: 31 g/dL (ref 30.0–36.0)
MCV: 72.9 fL — ABNORMAL LOW (ref 78.0–100.0)
PLATELETS: 219 10*3/uL (ref 150–400)
RBC: 4.2 MIL/uL (ref 3.87–5.11)
RDW: 14.3 % (ref 11.5–15.5)
WBC: 18.5 10*3/uL — ABNORMAL HIGH (ref 4.0–10.5)

## 2016-12-17 LAB — CULTURE, BETA STREP (GROUP B ONLY)

## 2016-12-17 LAB — RPR: RPR: NONREACTIVE

## 2016-12-17 LAB — GLUCOSE, CAPILLARY
GLUCOSE-CAPILLARY: 150 mg/dL — AB (ref 65–99)
Glucose-Capillary: 167 mg/dL — ABNORMAL HIGH (ref 65–99)

## 2016-12-17 LAB — OB RESULTS CONSOLE GBS: GBS: POSITIVE

## 2016-12-17 MED ORDER — MAGNESIUM SULFATE 40 G IN LACTATED RINGERS - SIMPLE
2.0000 g/h | INTRAVENOUS | Status: AC
  Administered 2016-12-17: 2 g/h via INTRAVENOUS
  Filled 2016-12-17: qty 500

## 2016-12-17 MED ORDER — LACTATED RINGERS IV SOLN
INTRAVENOUS | Status: DC
  Administered 2016-12-17 – 2016-12-19 (×2): via INTRAVENOUS

## 2016-12-17 MED ORDER — MAGNESIUM SULFATE BOLUS VIA INFUSION
4.0000 g | Freq: Once | INTRAVENOUS | Status: AC
  Administered 2016-12-17: 4 g via INTRAVENOUS
  Filled 2016-12-17: qty 500

## 2016-12-17 NOTE — Progress Notes (Addendum)
FACULTY PRACTICE ANTEPARTUM COMPREHENSIVE PROGRESS NOTE  Haley Griffin is a 31 y.o. G3P2002 at [redacted]w[redacted]d  With Di/DI twins who is admitted for PPROM of twin A.  Estimated Date of Delivery: 03/31/17 Fetal presentation is cephalic both twins  Length of Stay:  1 Days. Admitted 12/16/2016  Subjective: Patient doing well - had some contractions overnight. No other complaints. Reports maybe some abdominal pain 1x per hour. On Neuro-protection magnesium.   Patient reports good fetal movement.  She reports irregular uterine contractions, no bleeding and no loss of fluid per vagina.  Vitals:  Blood pressure 116/75, pulse (!) 102, temperature 98.1 F (36.7 C), temperature source Oral, resp. rate 18, height 4\' 11"  (1.499 m), weight 144 lb (65.3 kg), last menstrual period 06/16/2016, SpO2 96 %.  Physical Examination: Gen: Well Appearing NAD PULM: no respiratory distress ABD: soft NT, gravid Ext: intact distal pulses no swelling  CERVIX: Dilation: 2 Exam by:: Dr. Jolayne Panther  Fetal monitoring: Twin AFHR: 130 bpm, Twin B 135 Variability: moderate both twin A and B, Accelerations: not present Decelerations: not present Uterine activity: no contractions on monitor  Results for orders placed or performed during the hospital encounter of 12/16/16 (from the past 48 hour(s))  Type and screen [facility]     Status: None   Collection Time: 12/16/16  6:25 PM  Result Value Ref Range   ABO/RH(D) A POS    Antibody Screen NEG    Sample Expiration 12/19/2016   Culture, beta strep (group b only)     Status: Abnormal   Collection Time: 12/16/16  7:38 PM  Result Value Ref Range   Specimen Description VAGINAL/RECTAL    Special Requests NONE    Culture (A)     GROUP B STREP(S.AGALACTIAE)ISOLATED CRITICAL RESULT CALLED TO, READ BACK BY AND VERIFIED WITH: DR. Sundra Aland, AT 1332 12/17/16  BY Renato Shin Performed at [facility], [address], [place], [place] [address]    Report Status 12/17/2016  FINAL   CBC     Status: Abnormal   Collection Time: 12/17/16  5:51 AM  Result Value Ref Range   WBC 18.5 (H) 4.0 - 10.5 K/uL   RBC 4.20 3.87 - 5.11 MIL/uL   Hemoglobin 9.5 (L) 12.0 - 15.0 g/dL   HCT 78.9 (L) 38.1 - 01.7 %   MCV 72.9 (L) 78.0 - 100.0 fL   MCH 22.6 (L) 26.0 - 34.0 pg   MCHC 31.0 30.0 - 36.0 g/dL   RDW 51.0 25.8 - 52.7 %   Platelets 219 150 - 400 K/uL  RPR     Status: None   Collection Time: 12/17/16  5:51 AM  Result Value Ref Range   RPR Ser Ql Non Reactive Non Reactive    Comment: (NOTE) Performed At: [facility] [address] [place], [place] [id number] Mila Homer MD RW:[id number]   Glucose, capillary     Status: Abnormal   Collection Time: 12/17/16 11:24 AM  Result Value Ref Range   Glucose-Capillary 150 (H) 65 - 99 mg/dL    [place] Mfm Ob Follow Up  Result Date: 12/16/2016 ----------------------------------------------------------------------  OBSTETRICS REPORT                      (Signed Final 12/16/2016 05:04 pm) ---------------------------------------------------------------------- Patient Info  ID #:       [id number]  D.O.B.:   1986/07/28 (31 yrs)  Name:       Haley Griffin                        Visit Date:  12/16/2016 03:40 pm ---------------------------------------------------------------------- Performed By  Performed By:     Percell Boston          Ref. Address:     [facility]                    RDMS                                                             Health  Attending:        Particia Nearing MD       Location:         [facility]  Referred By:      Felipe Drone ---------------------------------------------------------------------- Orders   #  Description                                 Code   1  [place] MFM OB FOLLOW UP                         [phone]   2  [place] MFM OB FOLLOW UP ADDL GEST               45409.81  ----------------------------------------------------------------------   #  Ordered By                Order #        Accession #    Episode #   1  Alpha Gula            [id number]      [id number]     [id number]   2  PAUL WHITECAR            [id number]      [id number]     [id number]  ---------------------------------------------------------------------- Indications   [redacted] weeks gestation of pregnancy                Z3A.25   Twin pregnancy, di/di, second trimester        O30.042   Poor obstetric history: Previous               O09.299   preeclampsia / eclampsia/gestational HTN   Hypertension - Chronic/Pre-existing;  ASA      O10.019   Gestational diabetes in                        O44.419   pregnancy(borderline), unspecified control  ---------------------------------------------------------------------- OB History  Blood Type:            Height:         Weight (lb):  146      BMI:  Gravidity:    3         Term:   2        Prem:   0        SAB:  0  TOP:          0       Ectopic:  0        Living: 2 ---------------------------------------------------------------------- Fetal Evaluation (Fetus A)  Num Of Fetuses:     2  Fetal Heart         148  Rate(bpm):  Cardiac Activity:   Observed  Fetal Lie:          Maternal left side  Presentation:       Cephalic  Placenta:           Anterior, above cervical os  P. Cord Insertion:  Visualized, central  Amniotic Fluid  AFI FV:      Subjectively within normal limits                              Largest Pocket(cm)                              4.7 ---------------------------------------------------------------------- Biometry (Fetus A)  BPD:      56.1  mm     G. Age:  23w 1d          2  %    CI:        71.13   %   70 - 86                                                          FL/HC:      19.9   %   18.7 - 20.3  HC:      211.9  mm     G. Age:  23w 2d        < 3  %    HC/AC:      1.11       1.04 - 1.22  AC:       191   mm     G. Age:  23w 6d         12  %    FL/BPD:     75.2   %   71 - 87  FL:       42.2  mm     G. Age:  23w 5d          8  %    FL/AC:      22.1   %   20 - 24   HUM:      38.5  mm     G. Age:  23w 5d         11  %  Est. FW:     622  gm      1 lb 6 oz     24  %     FW Discordancy      0 \ 5 % ---------------------------------------------------------------------- Gestational Age (Fetus A)  LMP:           26w 1d       Date:   06/16/16                 EDD:   03/23/17  U/S Today:     23w 4d  EDD:   04/10/17  Best:          Alcus Dad 0d    Det. By:   U/S Fetus A              EDD:   03/31/17                                      (11/14/16) ---------------------------------------------------------------------- Anatomy (Fetus A)  Cranium:               Appears normal         Aortic Arch:            Previously seen  Cavum:                 Previously seen        Ductal Arch:            Not well visualized  Ventricles:            Appears normal         Diaphragm:              Previously seen  Choroid Plexus:        Previously seen        Stomach:                Appears normal, left                                                                        sided  Cerebellum:            Previously seen        Abdomen:                Previously seen  Posterior Fossa:       Previously seen        Abdominal Wall:         Previously seen  Nuchal Fold:           Previously seen        Cord Vessels:           Previously seen  Face:                  Orbits nl; profile not Kidneys:                Appear normal                         well visualized  Lips:                  Previously seen        Bladder:                Appears normal  Thoracic:              Appears normal         Spine:                  Appears normal  Heart:  Appears normal         Upper Extremities:      Previously seen                         (4CH, axis, and situs  RVOT:                  Not well visualized    Lower Extremities:      Previously seen  LVOT:                  Appears normal  Other:  Female gender. Rt heel and 5th digits previously visualized.  ---------------------------------------------------------------------- Fetal Evaluation (Fetus B)  Num Of Fetuses:     2  Cardiac Activity:   Observed  Fetal Lie:          Maternal right side  Presentation:       Cephalic  Placenta:           Anterior, above cervical os  P. Cord Insertion:  Visualized, central  Membrane Desc:      Dividing Membrane seen  Amniotic Fluid  AFI FV:      Subjectively within normal limits                              Largest Pocket(cm)                              4.5 ---------------------------------------------------------------------- Biometry (Fetus B)  BPD:      55.9  mm     G. Age:  23w 0d          2  %    CI:        70.87   %   70 - 86                                                          FL/HC:      18.1   %   18.7 - 20.3  HC:      211.6  mm     G. Age:  23w 2d        < 3  %    HC/AC:      1.08       1.04 - 1.22  AC:       196   mm     G. Age:  24w 2d         21  %    FL/BPD:     68.7   %   71 - 87  FL:       38.4  mm     G. Age:  22w 2d        < 3  %    FL/AC:      19.6   %   20 - 24  HUM:      34.1  mm     G. Age:  21w 4d        < 5  %  Est. FW:     590  gm      1 lb 5 oz     21  %  FW Discordancy         5  % ---------------------------------------------------------------------- Gestational Age (Fetus B)  LMP:           26w 1d       Date:   06/16/16                 EDD:   03/23/17  U/S Today:     23w 2d                                        EDD:   04/12/17  Best:          25w 0d    Det. By:   U/S Fetus A              EDD:   03/31/17                                      (11/14/16) ---------------------------------------------------------------------- Anatomy (Fetus B)  Cranium:               Appears normal         Aortic Arch:            Not well visualized  Cavum:                 Previously seen        Ductal Arch:            Not well visualized  Ventricles:            Appears normal         Diaphragm:              Not well visualized  Choroid Plexus:        Previously  seen        Stomach:                Appears normal, left                                                                        sided  Cerebellum:            Previously seen        Abdomen:                Previously seen  Posterior Fossa:       Previously seen        Abdominal Wall:         Previously seen  Nuchal Fold:           Previously seen        Cord Vessels:           Appears normal (3  vessel cord)  Face:                  Not well visualized    Kidneys:                Appear normal  Lips:                  Previously seen        Bladder:                Appears normal  Thoracic:              Appears normal         Spine:                  Previously seen  Heart:                 Appears normal         Upper Extremities:      Previously seen                         (4CH, axis, and situs  RVOT:                  Not well visualized    Lower Extremities:      Previously seen  LVOT:                  Not well visualized  Other:  Female gender. ---------------------------------------------------------------------- Cervix Uterus Adnexa  Cervix  Not adaquately visualized  Uterus  No abnormality visualized.  Left Ovary  No adnexal mass visualized.  Right Ovary  No adnexal mass visualized.  Cul De Sac:   No free fluid seen.  Adnexa:       No abnormality visualized. ---------------------------------------------------------------------- Comments  Pt told sonographer she was "all wet" when she walked into  [place] room. ---------------------------------------------------------------------- Impression  Dichorionic/diamniotic twin pregnancy at 25+0 weeks (based  on Twin A EGA on 03/30 study)  Normal interval anatomy; anatomic surveys incomplete  Twin A: Low normal amniotic fluid volume; Twin B: Normal  amniotic fluid volume  Appropriate interval growths with EFW at the 24th and 21st  %tiles (622 and 590 grams)  TA views of cervix: fluid seen in cervix and vagina;  unable to  visualize cervix ----------------------------------------------------------------------                 Particia Nearing, MD Electronically Signed Final Report   12/16/2016 05:04 pm ----------------------------------------------------------------------  [place] Mfm Ob Follow Up Addl Gest  Result Date: 12/16/2016 ----------------------------------------------------------------------  OBSTETRICS REPORT                      (Signed Final 12/16/2016 05:04 pm) ---------------------------------------------------------------------- Patient Info  ID #:       [id number]                         D.O.B.:   [birth year] (31 yrs)  Name:       Haley Griffin                        Visit Date:  12/16/2016 03:40 pm ---------------------------------------------------------------------- Performed By  Performed By:     Percell Boston          Ref. Address:     [facility]  RDMS                                                             Health  Attending:        Particia Nearing MD       Location:         [facility]  Referred By:      Felipe Drone ---------------------------------------------------------------------- Orders   #  Description                                 Code   1  [place] MFM OB FOLLOW UP                         16109.60   2  [place] MFM OB FOLLOW UP ADDL GEST               45409.81  ----------------------------------------------------------------------   #  Ordered By               Order #        Accession #    Episode #   1  Alpha Gula            [id number]      [id number]     [id number]   2  PAUL WHITECAR            [id number]      [id number]     [id number]  ---------------------------------------------------------------------- Indications   [redacted] weeks gestation of pregnancy                Z3A.25   Twin pregnancy, di/di, second trimester        O30.042   Poor obstetric history: Previous               O09.299   preeclampsia / eclampsia/gestational HTN   Hypertension -  Chronic/Pre-existing;  ASA      O10.019   Gestational diabetes in                        O59.419   pregnancy(borderline), unspecified control  ---------------------------------------------------------------------- OB History  Blood Type:            Height:         Weight (lb):  146      BMI:  Gravidity:    3         Term:   2        Prem:   0        SAB:   0  TOP:          0       Ectopic:  0        Living: 2 ---------------------------------------------------------------------- Fetal Evaluation (Fetus A)  Num Of Fetuses:     2  Fetal Heart         148  Rate(bpm):  Cardiac Activity:   Observed  Fetal Lie:          Maternal left side  Presentation:       Cephalic  Placenta:  Anterior, above cervical os  P. Cord Insertion:  Visualized, central  Amniotic Fluid  AFI FV:      Subjectively within normal limits                              Largest Pocket(cm)                              4.7 ---------------------------------------------------------------------- Biometry (Fetus A)  BPD:      56.1  mm     G. Age:  23w 1d          2  %    CI:        71.13   %   70 - 86                                                          FL/HC:      19.9   %   18.7 - 20.3  HC:      211.9  mm     G. Age:  23w 2d        < 3  %    HC/AC:      1.11       1.04 - 1.22  AC:       191   mm     G. Age:  23w 6d         12  %    FL/BPD:     75.2   %   71 - 87  FL:       42.2  mm     G. Age:  23w 5d          8  %    FL/AC:      22.1   %   20 - 24  HUM:      38.5  mm     G. Age:  23w 5d         11  %  Est. FW:     622  gm      1 lb 6 oz     24  %     FW Discordancy      0 \ 5 % ---------------------------------------------------------------------- Gestational Age (Fetus A)  LMP:           26w 1d       Date:   06/16/16                 EDD:   03/23/17  U/S Today:     23w 4d                                        EDD:   04/10/17  Best:          25w 0d    Det. By:   U/S Fetus A              EDD:   03/31/17                                       (  11/14/16) ---------------------------------------------------------------------- Anatomy (Fetus A)  Cranium:               Appears normal         Aortic Arch:            Previously seen  Cavum:                 Previously seen        Ductal Arch:            Not well visualized  Ventricles:            Appears normal         Diaphragm:              Previously seen  Choroid Plexus:        Previously seen        Stomach:                Appears normal, left                                                                        sided  Cerebellum:            Previously seen        Abdomen:                Previously seen  Posterior Fossa:       Previously seen        Abdominal Wall:         Previously seen  Nuchal Fold:           Previously seen        Cord Vessels:           Previously seen  Face:                  Orbits nl; profile not Kidneys:                Appear normal                         well visualized  Lips:                  Previously seen        Bladder:                Appears normal  Thoracic:              Appears normal         Spine:                  Appears normal  Heart:                 Appears normal         Upper Extremities:      Previously seen                         (4CH, axis, and situs  RVOT:                  Not well visualized    Lower Extremities:      Previously  seen  LVOT:                  Appears normal  Other:  Female gender. Rt heel and 5th digits previously visualized. ---------------------------------------------------------------------- Fetal Evaluation (Fetus B)  Num Of Fetuses:     2  Cardiac Activity:   Observed  Fetal Lie:          Maternal right side  Presentation:       Cephalic  Placenta:           Anterior, above cervical os  P. Cord Insertion:  Visualized, central  Membrane Desc:      Dividing Membrane seen  Amniotic Fluid  AFI FV:      Subjectively within normal limits                              Largest Pocket(cm)                              4.5  ---------------------------------------------------------------------- Biometry (Fetus B)  BPD:      55.9  mm     G. Age:  23w 0d          2  %    CI:        70.87   %   70 - 86                                                          FL/HC:      18.1   %   18.7 - 20.3  HC:      211.6  mm     G. Age:  23w 2d        < 3  %    HC/AC:      1.08       1.04 - 1.22  AC:       196   mm     G. Age:  24w 2d         21  %    FL/BPD:     68.7   %   71 - 87  FL:       38.4  mm     G. Age:  22w 2d        < 3  %    FL/AC:      19.6   %   20 - 24  HUM:      34.1  mm     G. Age:  21w 4d        < 5  %  Est. FW:     590  gm      1 lb 5 oz     21  %     FW Discordancy         5  % ---------------------------------------------------------------------- Gestational Age (Fetus B)  LMP:           26w 1d       Date:   06/16/16                 EDD:   03/23/17  U/S Today:     23w 2d  EDD:   04/12/17  Best:          Alcus Dad 0d    Det. By:   U/S Fetus A              EDD:   03/31/17                                      (11/14/16) ---------------------------------------------------------------------- Anatomy (Fetus B)  Cranium:               Appears normal         Aortic Arch:            Not well visualized  Cavum:                 Previously seen        Ductal Arch:            Not well visualized  Ventricles:            Appears normal         Diaphragm:              Not well visualized  Choroid Plexus:        Previously seen        Stomach:                Appears normal, left                                                                        sided  Cerebellum:            Previously seen        Abdomen:                Previously seen  Posterior Fossa:       Previously seen        Abdominal Wall:         Previously seen  Nuchal Fold:           Previously seen        Cord Vessels:           Appears normal (3                                                                        vessel cord)  Face:                   Not well visualized    Kidneys:                Appear normal  Lips:                  Previously seen        Bladder:                Appears normal  Thoracic:  Appears normal         Spine:                  Previously seen  Heart:                 Appears normal         Upper Extremities:      Previously seen                         (4CH, axis, and situs  RVOT:                  Not well visualized    Lower Extremities:      Previously seen  LVOT:                  Not well visualized  Other:  Female gender. ---------------------------------------------------------------------- Cervix Uterus Adnexa  Cervix  Not adaquately visualized  Uterus  No abnormality visualized.  Left Ovary  No adnexal mass visualized.  Right Ovary  No adnexal mass visualized.  Cul De Sac:   No free fluid seen.  Adnexa:       No abnormality visualized. ---------------------------------------------------------------------- Comments  Pt told sonographer she was "all wet" when she walked into  [place] room. ---------------------------------------------------------------------- Impression  Dichorionic/diamniotic twin pregnancy at 25+0 weeks (based  on Twin A EGA on 03/30 study)  Normal interval anatomy; anatomic surveys incomplete  Twin A: Low normal amniotic fluid volume; Twin B: Normal  amniotic fluid volume  Appropriate interval growths with EFW at the 24th and 21st  %tiles (622 and 590 grams)  TA views of cervix: fluid seen in cervix and vagina; unable to  visualize cervix ----------------------------------------------------------------------                 Particia Nearing, MD Electronically Signed Final Report   12/16/2016 05:04 pm ----------------------------------------------------------------------   Current scheduled medications . [START ON 12/18/2016] amoxicillin  500 mg Oral Q8H  . betamethasone acetate-betamethasone sodium phosphate  12 mg Intramuscular Q24H  . docusate sodium  100 mg Oral Daily  . prenatal multivitamin  1  tablet Oral Q1200    I have reviewed the patient's current medications.  ASSESSMENT: Active Problems:   Preterm premature rupture of membranes (PPROM) with unknown onset of labor   PLAN: -continue latency antibiotics -qshift NST -continuous toco -NICU consult with in person interpreter tomorrow- coordinated with interpreter services at 10AM -continue mag for 12 hours then D/C -BMZ x2  GDMA1 - last CBG 150 - continue to monitor. Likely elevated by BMZ. Will continue to monitor. Consider metformin versus insulin if remain elevated  cHTN: BP controlled. Continue to monitor. Continue routine antenatal care.  Ernestina Penna MD OB Fellow Faculty Practice, [facility]

## 2016-12-17 NOTE — Progress Notes (Deleted)
While in ultrasound pt had an unmeasured void. Faculty practice notified. Pt encouraged to start another 24 hour urine. Pt sent home supplies and given instruction re: 24 hour urine. If pt collects urine she will bring it to her clinic visit on Friday.  

## 2016-12-17 NOTE — Progress Notes (Signed)
Patient hit emergency button complaining of pain in abdomen; EFM applied and assessing at this time

## 2016-12-17 NOTE — Progress Notes (Signed)
Pt has a patent 18 gauge angio in her right wrist. No documentation of placement noted. Saline Locked at 2015 by night shift RN.

## 2016-12-17 NOTE — Progress Notes (Signed)
Dr Jolayne Panther notified of patient complaint and uterine activity; orders given for Mag sulfate x12 hours and restart LR and standard rate at this time

## 2016-12-18 LAB — GLUCOSE, CAPILLARY
GLUCOSE-CAPILLARY: 130 mg/dL — AB (ref 65–99)
GLUCOSE-CAPILLARY: 126 mg/dL — AB (ref 65–99)
GLUCOSE-CAPILLARY: 101 mg/dL — AB (ref 65–99)

## 2016-12-18 NOTE — Progress Notes (Signed)
[employer] called by RN to complete assessment and answer any questions pt/spouse may have regarding care.

## 2016-12-18 NOTE — Progress Notes (Signed)
Inpatient Diabetes Program Recommendations  Diabetes Treatment Program Recommendations  ADA Standards of Care 2018 Diabetes in Pregnancy Target Glucose Ranges:  Fasting: 60 - 90 mg/dL Preprandial: 60 - 161105 mg/dL 1 hr postprandial: Less than 140mg /dL (from first bite of meal) 2 hr postprandial: Less than 120 mg/dL (from first bite of meal)   Results for Maryjean MornSIU, Heyli (MRN [id number]) as of 12/18/2016 10:18  Ref. Range 12/17/2016 11:24 12/17/2016 22:01 12/18/2016 05:53  Glucose-Capillary Latest Ref Range: 65 - 99 mg/dL 811150 (H) 914167 (H) 782130 (H)   Review of Glycemic Control  Diabetes history: GDM (diet controlled) Outpatient Diabetes medications: None Current orders for Inpatient glycemic control: None  Inpatient Diabetes Program Recommendations: Correction (SSI): Patient received Betamethasone on 5/1 and 5/2 which is contributing to hyperglycemia. May want to consider using Pregnant Patient Glycemic Control order set for CBGs with Novolog correction QID (fasting and 2 hour post prandial). Anticipate glycemic control will improve as steroids wear off.  Thanks, Orlando PennerMarie Kiesha Ensey, RN, MSN, CDE Diabetes Coordinator Inpatient Diabetes Program [phone] (Team Pager from 8am to 5pm)

## 2016-12-18 NOTE — Consult Note (Signed)
Neonatology Consult Note:  At the request of the patients obstetrician Dr. Constant I met with Haley Griffin and her husband (via an interpreter).  Mrs. Karas is currently 25 2 weeks with pregnancy complicated by di-di twins, PPROM (5/1) and oligohydramnios of twin A, GDMA1 and CHTN on no medications.  She has experienced some contractions and is being managed with latency antibiotics, magnesium sulfate, betamethasone and monitoring.   We discussed morbidity/mortality at this gestional age, delivery room resuscitation, including intubation and surfactant in DR.  Discussed mechanical ventilation and risk for chronic lung disease, risk for IVH with potential for motor / cognitive deficits, ROP, NEC, sepsis, as well as temperature instability and feeding immaturity.  Discussed NG / OG feeds, benefits of MBM in reducing incidence of NEC.   Discussed likely length of stay. Thank you for allowing us to participate in her care.  Please call with questions.  Benjamin Rattray, DO  Neonatologist  The total length of face-to-face or floor / unit time for this encounter was 25 minutes.  Counseling and / or coordination of care was greater than fifty percent of the time.       

## 2016-12-18 NOTE — Progress Notes (Signed)
IV site to right wrist clean, dry, and intact

## 2016-12-18 NOTE — Procedures (Signed)
Husband at bedside to interpret. Patient states that she ate dinner around 4-5pm and does not plan to eat before bed. RN advises patient to eat a snack before bed. Patient informed that a blood sugar will be checked in the morning.

## 2016-12-18 NOTE — Progress Notes (Signed)
FACULTY PRACTICE ANTEPARTUM COMPREHENSIVE PROGRESS NOTE  Haley Griffin is a 31 y.o. G3P2002 at [redacted]w[redacted]d  With Di/DI twins who is admitted for PPROM of twin A.  Estimated Date of Delivery: 03/31/17 Fetal presentation is cephalic both twins  Length of Stay:  2 Days. Admitted 12/16/2016  Subjective: Patient doing well - had some contractions overnight. No other complaints. Reports maybe some abdominal pain 1x per hour.   Patient reports good fetal movement.  She reports irregular uterine contractions, no bleeding and no loss of fluid per vagina.  Vitals:  Blood pressure 107/65, pulse (!) 102, temperature 98.4 F (36.9 C), temperature source Oral, resp. rate 18, height 4\' 11"  (1.499 m), weight 144 lb (65.3 kg), last menstrual period 06/16/2016, SpO2 98 %.  Physical Examination: Gen: Well Appearing NAD PULM: no respiratory distress ABD: soft NT, gravid Ext: intact distal pulses no swelling  CERVIX: Dilation: 2 Exam by:: Dr. Jolayne Panther  Fetal monitoring: Twin AFHR: 130 bpm, Twin B 135 Variability: moderate both twin A and B, Accelerations: not present Decelerations: not present Uterine activity: no contractions on monitor  Results for orders placed or performed during the hospital encounter of 12/16/16 (from the past 48 hour(s))  Type and screen [facility]     Status: None   Collection Time: 12/16/16  6:25 PM  Result Value Ref Range   ABO/RH(D) A POS    Antibody Screen NEG    Sample Expiration 12/19/2016   Culture, beta strep (group b only)     Status: Abnormal   Collection Time: 12/16/16  7:38 PM  Result Value Ref Range   Specimen Description VAGINAL/RECTAL    Special Requests NONE    Culture (A)     GROUP B STREP(S.AGALACTIAE)ISOLATED CRITICAL RESULT CALLED TO, READ BACK BY AND VERIFIED WITH: DR. Sundra Aland, AT 1332 12/17/16  BY Renato Shin Performed at [facility], [address], [place], [place] [address]    Report Status 12/17/2016 FINAL   OB RESULT CONSOLE Group B  Strep     Status: None   Collection Time: 12/17/16 12:00 AM  Result Value Ref Range   GBS Positive   CBC     Status: Abnormal   Collection Time: 12/17/16  5:51 AM  Result Value Ref Range   WBC 18.5 (H) 4.0 - 10.5 K/uL   RBC 4.20 3.87 - 5.11 MIL/uL   Hemoglobin 9.5 (L) 12.0 - 15.0 g/dL   HCT 60.4 (L) 54.0 - 98.1 %   MCV 72.9 (L) 78.0 - 100.0 fL   MCH 22.6 (L) 26.0 - 34.0 pg   MCHC 31.0 30.0 - 36.0 g/dL   RDW 19.1 47.8 - 29.5 %   Platelets 219 150 - 400 K/uL  RPR     Status: None   Collection Time: 12/17/16  5:51 AM  Result Value Ref Range   RPR Ser Ql Non Reactive Non Reactive    Comment: (NOTE) Performed At: [facility] [address] [place], [place] [id number] Mila Homer MD QI:[phone]   Glucose, capillary     Status: Abnormal   Collection Time: 12/17/16 11:24 AM  Result Value Ref Range   Glucose-Capillary 150 (H) 65 - 99 mg/dL  Glucose, capillary     Status: Abnormal   Collection Time: 12/17/16 10:01 PM  Result Value Ref Range   Glucose-Capillary 167 (H) 65 - 99 mg/dL  Glucose, capillary     Status: Abnormal   Collection Time: 12/18/16  5:53 AM  Result Value  Ref Range   Glucose-Capillary 130 (H) 65 - 99 mg/dL  Glucose, capillary     Status: Abnormal   Collection Time: 12/18/16 11:57 AM  Result Value Ref Range   Glucose-Capillary 126 (H) 65 - 99 mg/dL    [place] Mfm Ob Follow Up  Result Date: 12/16/2016 ----------------------------------------------------------------------  OBSTETRICS REPORT                      (Signed Final 12/16/2016 05:04 pm) ---------------------------------------------------------------------- Patient Info  ID #:       [id number]                         D.O.B.:   1986/07/18 (31 yrs)  Name:       Haley Griffin                        Visit Date:  12/16/2016 03:40 pm ---------------------------------------------------------------------- Performed By  Performed By:     Percell Boston          Ref. Address:     [facility]                    RDMS                                                              Health  Attending:        Particia Nearing MD       Location:         [facility]  Referred By:      Felipe Drone ---------------------------------------------------------------------- Orders   #  Description                                 Code   1  [place] MFM OB FOLLOW UP                         [id number]   2  [place] MFM OB FOLLOW UP ADDL GEST               40981.19  ----------------------------------------------------------------------   #  Ordered By               Order #        Accession #    Episode #   1  Alpha Gula            [id number]      [id number]     [id number]   2  PAUL WHITECAR            [id number]      [id number]     [id number]  ---------------------------------------------------------------------- Indications   [redacted] weeks gestation of pregnancy                Z3A.25   Twin pregnancy, di/di, second trimester        O30.042   Poor obstetric history: Previous               O09.299   preeclampsia / eclampsia/gestational HTN  Hypertension - Chronic/Pre-existing;  ASA      O10.019   Gestational diabetes in                        O37.419   pregnancy(borderline), unspecified control  ---------------------------------------------------------------------- OB History  Blood Type:            Height:         Weight (lb):  146      BMI:  Gravidity:    3         Term:   2        Prem:   0        SAB:   0  TOP:          0       Ectopic:  0        Living: 2 ---------------------------------------------------------------------- Fetal Evaluation (Fetus A)  Num Of Fetuses:     2  Fetal Heart         148  Rate(bpm):  Cardiac Activity:   Observed  Fetal Lie:          Maternal left side  Presentation:       Cephalic  Placenta:           Anterior, above cervical os  P. Cord Insertion:  Visualized, central  Amniotic Fluid  AFI FV:      Subjectively within normal limits                              Largest Pocket(cm)                              4.7  ---------------------------------------------------------------------- Biometry (Fetus A)  BPD:      56.1  mm     G. Age:  23w 1d          2  %    CI:        71.13   %   70 - 86                                                          FL/HC:      19.9   %   18.7 - 20.3  HC:      211.9  mm     G. Age:  23w 2d        < 3  %    HC/AC:      1.11       1.04 - 1.22  AC:       191   mm     G. Age:  23w 6d         12  %    FL/BPD:     75.2   %   71 - 87  FL:       42.2  mm     G. Age:  23w 5d          8  %    FL/AC:      22.1   %   20 - 24  HUM:      38.5  mm     G.  Age:  23w 5d         11  %  Est. FW:     622  gm      1 lb 6 oz     24  %     FW Discordancy      0 \ 5 % ---------------------------------------------------------------------- Gestational Age (Fetus A)  LMP:           26w 1d       Date:   06/16/16                 EDD:   03/23/17  U/S Today:     23w 4d                                        EDD:   04/10/17  Best:          25w 0d    Det. By:   U/S Fetus A              EDD:   03/31/17                                      (11/14/16) ---------------------------------------------------------------------- Anatomy (Fetus A)  Cranium:               Appears normal         Aortic Arch:            Previously seen  Cavum:                 Previously seen        Ductal Arch:            Not well visualized  Ventricles:            Appears normal         Diaphragm:              Previously seen  Choroid Plexus:        Previously seen        Stomach:                Appears normal, left                                                                        sided  Cerebellum:            Previously seen        Abdomen:                Previously seen  Posterior Fossa:       Previously seen        Abdominal Wall:         Previously seen  Nuchal Fold:           Previously seen        Cord Vessels:           Previously seen  Face:                  Orbits nl; profile  not Kidneys:                Appear normal                         well  visualized  Lips:                  Previously seen        Bladder:                Appears normal  Thoracic:              Appears normal         Spine:                  Appears normal  Heart:                 Appears normal         Upper Extremities:      Previously seen                         (4CH, axis, and situs  RVOT:                  Not well visualized    Lower Extremities:      Previously seen  LVOT:                  Appears normal  Other:  Female gender. Rt heel and 5th digits previously visualized. ---------------------------------------------------------------------- Fetal Evaluation (Fetus B)  Num Of Fetuses:     2  Cardiac Activity:   Observed  Fetal Lie:          Maternal right side  Presentation:       Cephalic  Placenta:           Anterior, above cervical os  P. Cord Insertion:  Visualized, central  Membrane Desc:      Dividing Membrane seen  Amniotic Fluid  AFI FV:      Subjectively within normal limits                              Largest Pocket(cm)                              4.5 ---------------------------------------------------------------------- Biometry (Fetus B)  BPD:      55.9  mm     G. Age:  23w 0d          2  %    CI:        70.87   %   70 - 86                                                          FL/HC:      18.1   %   18.7 - 20.3  HC:      211.6  mm     G. Age:  23w 2d        < 3  %    HC/AC:      1.08       1.04 - 1.22  AC:       196   mm     G. Age:  24w 2d         21  %    FL/BPD:     68.7   %   71 - 87  FL:       38.4  mm     G. Age:  22w 2d        < 3  %    FL/AC:      19.6   %   20 - 24  HUM:      34.1  mm     G. Age:  21w 4d        < 5  %  Est. FW:     590  gm      1 lb 5 oz     21  %     FW Discordancy         5  % ---------------------------------------------------------------------- Gestational Age (Fetus B)  LMP:           26w 1d       Date:   06/16/16                 EDD:   03/23/17  U/S Today:     23w 2d                                        EDD:   04/12/17  Best:           25w 0d    Det. By:   U/S Fetus A              EDD:   03/31/17                                      (11/14/16) ---------------------------------------------------------------------- Anatomy (Fetus B)  Cranium:               Appears normal         Aortic Arch:            Not well visualized  Cavum:                 Previously seen        Ductal Arch:            Not well visualized  Ventricles:            Appears normal         Diaphragm:              Not well visualized  Choroid Plexus:        Previously seen        Stomach:                Appears normal, left                                                                        sided  Cerebellum:  Previously seen        Abdomen:                Previously seen  Posterior Fossa:       Previously seen        Abdominal Wall:         Previously seen  Nuchal Fold:           Previously seen        Cord Vessels:           Appears normal (3                                                                        vessel cord)  Face:                  Not well visualized    Kidneys:                Appear normal  Lips:                  Previously seen        Bladder:                Appears normal  Thoracic:              Appears normal         Spine:                  Previously seen  Heart:                 Appears normal         Upper Extremities:      Previously seen                         (4CH, axis, and situs  RVOT:                  Not well visualized    Lower Extremities:      Previously seen  LVOT:                  Not well visualized  Other:  Female gender. ---------------------------------------------------------------------- Cervix Uterus Adnexa  Cervix  Not adaquately visualized  Uterus  No abnormality visualized.  Left Ovary  No adnexal mass visualized.  Right Ovary  No adnexal mass visualized.  Cul De Sac:   No free fluid seen.  Adnexa:       No abnormality visualized. ---------------------------------------------------------------------- Comments  Pt told  sonographer she was "all wet" when she walked into  [place] room. ---------------------------------------------------------------------- Impression  Dichorionic/diamniotic twin pregnancy at 25+0 weeks (based  on Twin A EGA on 03/30 study)  Normal interval anatomy; anatomic surveys incomplete  Twin A: Low normal amniotic fluid volume; Twin B: Normal  amniotic fluid volume  Appropriate interval growths with EFW at the 24th and 21st  %tiles (622 and 590 grams)  TA views of cervix: fluid seen in cervix and vagina; unable to  visualize cervix ----------------------------------------------------------------------                 Particia Nearing, MD Electronically Signed Final Report   12/16/2016 05:04 pm ----------------------------------------------------------------------  [place] Mfm Ob Follow Up Addl Gest  Result Date: 12/16/2016 ----------------------------------------------------------------------  OBSTETRICS REPORT                      (Signed Final 12/16/2016 05:04 pm) ---------------------------------------------------------------------- Patient Info  ID #:       [id number]                         D.O.B.:   30-Apr-1986 (31 yrs)  Name:       Haley Griffin                        Visit Date:  12/16/2016 03:40 pm ---------------------------------------------------------------------- Performed By  Performed By:     Percell Boston          Ref. Address:     [facility]                    RDMS                                                             Health  Attending:        Particia Nearing MD       Location:         [facility]  Referred By:      Felipe Drone ---------------------------------------------------------------------- Orders   #  Description                                 Code   1  [place] MFM OB FOLLOW UP                         [id number]   2  [place] MFM OB FOLLOW UP ADDL GEST               40981.19  ----------------------------------------------------------------------   #  Ordered By                Order #        Accession #    Episode #   1  Alpha Gula            [id number]      [id number]     [id number]   2  PAUL WHITECAR            [id number]      [id number]     [id number]  ---------------------------------------------------------------------- Indications   [redacted] weeks gestation of pregnancy                Z3A.25   Twin pregnancy, di/di, second trimester        O30.042   Poor obstetric history: Previous               O09.299   preeclampsia / eclampsia/gestational HTN   Hypertension - Chronic/Pre-existing;  ASA      O10.019   Gestational diabetes in                        O58.419  pregnancy(borderline), unspecified control  ---------------------------------------------------------------------- OB History  Blood Type:            Height:         Weight (lb):  146      BMI:  Gravidity:    3         Term:   2        Prem:   0        SAB:   0  TOP:          0       Ectopic:  0        Living: 2 ---------------------------------------------------------------------- Fetal Evaluation (Fetus A)  Num Of Fetuses:     2  Fetal Heart         148  Rate(bpm):  Cardiac Activity:   Observed  Fetal Lie:          Maternal left side  Presentation:       Cephalic  Placenta:           Anterior, above cervical os  P. Cord Insertion:  Visualized, central  Amniotic Fluid  AFI FV:      Subjectively within normal limits                              Largest Pocket(cm)                              4.7 ---------------------------------------------------------------------- Biometry (Fetus A)  BPD:      56.1  mm     G. Age:  23w 1d          2  %    CI:        71.13   %   70 - 86                                                          FL/HC:      19.9   %   18.7 - 20.3  HC:      211.9  mm     G. Age:  23w 2d        < 3  %    HC/AC:      1.11       1.04 - 1.22  AC:       191   mm     G. Age:  23w 6d         12  %    FL/BPD:     75.2   %   71 - 87  FL:       42.2  mm     G. Age:  23w 5d          8  %    FL/AC:      22.1   %   20 - 24  HUM:       38.5  mm     G. Age:  23w 5d         11  %  Est. FW:     622  gm      1 lb 6 oz     24  %  FW Discordancy      0 \ 5 % ---------------------------------------------------------------------- Gestational Age (Fetus A)  LMP:           26w 1d       Date:   06/16/16                 EDD:   03/23/17  U/S Today:     23w 4d                                        EDD:   04/10/17  Best:          25w 0d    Det. By:   U/S Fetus A              EDD:   03/31/17                                      (11/14/16) ---------------------------------------------------------------------- Anatomy (Fetus A)  Cranium:               Appears normal         Aortic Arch:            Previously seen  Cavum:                 Previously seen        Ductal Arch:            Not well visualized  Ventricles:            Appears normal         Diaphragm:              Previously seen  Choroid Plexus:        Previously seen        Stomach:                Appears normal, left                                                                        sided  Cerebellum:            Previously seen        Abdomen:                Previously seen  Posterior Fossa:       Previously seen        Abdominal Wall:         Previously seen  Nuchal Fold:           Previously seen        Cord Vessels:           Previously seen  Face:                  Orbits nl; profile not Kidneys:                Appear normal                         well visualized  Lips:                  Previously seen        Bladder:                Appears normal  Thoracic:              Appears normal         Spine:                  Appears normal  Heart:                 Appears normal         Upper Extremities:      Previously seen                         (4CH, axis, and situs  RVOT:                  Not well visualized    Lower Extremities:      Previously seen  LVOT:                  Appears normal  Other:  Female gender. Rt heel and 5th digits previously visualized.  ---------------------------------------------------------------------- Fetal Evaluation (Fetus B)  Num Of Fetuses:     2  Cardiac Activity:   Observed  Fetal Lie:          Maternal right side  Presentation:       Cephalic  Placenta:           Anterior, above cervical os  P. Cord Insertion:  Visualized, central  Membrane Desc:      Dividing Membrane seen  Amniotic Fluid  AFI FV:      Subjectively within normal limits                              Largest Pocket(cm)                              4.5 ---------------------------------------------------------------------- Biometry (Fetus B)  BPD:      55.9  mm     G. Age:  23w 0d          2  %    CI:        70.87   %   70 - 86                                                          FL/HC:      18.1   %   18.7 - 20.3  HC:      211.6  mm     G. Age:  23w 2d        < 3  %    HC/AC:      1.08       1.04 - 1.22  AC:       196   mm     G. Age:  24w 2d         21  %    FL/BPD:     68.7   %   71 -  87  FL:       38.4  mm     G. Age:  22w 2d        < 3  %    FL/AC:      19.6   %   20 - 24  HUM:      34.1  mm     G. Age:  21w 4d        < 5  %  Est. FW:     590  gm      1 lb 5 oz     21  %     FW Discordancy         5  % ---------------------------------------------------------------------- Gestational Age (Fetus B)  LMP:           26w 1d       Date:   06/16/16                 EDD:   03/23/17  U/S Today:     23w 2d                                        EDD:   04/12/17  Best:          25w 0d    Det. By:   U/S Fetus A              EDD:   03/31/17                                      (11/14/16) ---------------------------------------------------------------------- Anatomy (Fetus B)  Cranium:               Appears normal         Aortic Arch:            Not well visualized  Cavum:                 Previously seen        Ductal Arch:            Not well visualized  Ventricles:            Appears normal         Diaphragm:              Not well visualized  Choroid Plexus:        Previously  seen        Stomach:                Appears normal, left                                                                        sided  Cerebellum:            Previously seen        Abdomen:                Previously seen  Posterior Fossa:       Previously seen  Abdominal Wall:         Previously seen  Nuchal Fold:           Previously seen        Cord Vessels:           Appears normal (3                                                                        vessel cord)  Face:                  Not well visualized    Kidneys:                Appear normal  Lips:                  Previously seen        Bladder:                Appears normal  Thoracic:              Appears normal         Spine:                  Previously seen  Heart:                 Appears normal         Upper Extremities:      Previously seen                         (4CH, axis, and situs  RVOT:                  Not well visualized    Lower Extremities:      Previously seen  LVOT:                  Not well visualized  Other:  Female gender. ---------------------------------------------------------------------- Cervix Uterus Adnexa  Cervix  Not adaquately visualized  Uterus  No abnormality visualized.  Left Ovary  No adnexal mass visualized.  Right Ovary  No adnexal mass visualized.  Cul De Sac:   No free fluid seen.  Adnexa:       No abnormality visualized. ---------------------------------------------------------------------- Comments  Pt told sonographer she was "all wet" when she walked into  [place] room. ---------------------------------------------------------------------- Impression  Dichorionic/diamniotic twin pregnancy at 25+0 weeks (based  on Twin A EGA on 03/30 study)  Normal interval anatomy; anatomic surveys incomplete  Twin A: Low normal amniotic fluid volume; Twin B: Normal  amniotic fluid volume  Appropriate interval growths with EFW at the 24th and 21st  %tiles (622 and 590 grams)  TA views of cervix: fluid seen in cervix and vagina;  unable to  visualize cervix ----------------------------------------------------------------------                 Particia Nearing, MD Electronically Signed Final Report   12/16/2016 05:04 pm ----------------------------------------------------------------------   Current scheduled medications . amoxicillin  500 mg Oral Q8H  . docusate sodium  100 mg Oral Daily  . prenatal multivitamin  1 tablet Oral Q1200    I have reviewed the patient's current medications.  ASSESSMENT: [redacted]w[redacted]d Estimated  Date of Delivery: 03/31/17  Mercie Eoni Di twins Active Problems:   Preterm premature rupture of membranes (PPROM) with unknown onset of labor   PLAN: -continue latency antibiotics -qshift NST -continuous toco -NICU consult with in person interpreter tomorrow- coordinated with interpreter services at 1130am -s/p MgSO4 -BMZ x2  GDMA1 - last CBG 150 - continue to monitor. Likely elevated by BMZ. Will continue to monitor. Consider metformin versus insulin if remain elevated  cHTN: BP controlled. Continue to monitor. Continue routine antenatal care.  Patient ID: Maryjean MornYip Vonbargen, female   DOB: 03/28/1986, 31 y.o.   MRN: [id number]

## 2016-12-18 NOTE — Progress Notes (Addendum)
Initial Nutrition Assessment  DOCUMENTATION CODES:  Not applicable  INTERVENTION:  Carbohydrate modified gestational diet NUTRITION DIAGNOSIS:  Increased nutrient needs related to (S)  (pregnancy abd fetal growth requirments) as evidenced by  (25 week twin pregnancy).  GOAL:  Weight gain  MONITOR:  Weight trends  REASON FOR ASSESSMENT:  Antenatal, Gestational Diabetes   ASSESSMENT:  25 2/7 weeks adm with PROM twin A. GDM/ HTN with prev preg.  Serum glucose elevated s/p BMZ No PNR available from early preg, unknown pre-preg weight. RN reports good appetite observed at breakfast, she or husband order meals  Diet Order:  Diet gestational carb mod Room service appropriate? Yes; Fluid consistency: Thin  Height:   Ht Readings from Last 1 Encounters:  12/16/16 4\' 11"  (1.499 m)   Weight:   Wt Readings from Last 1 Encounters:  12/16/16 144 lb (65.3 kg)   Ideal Body Weight:    100 lbs  BMI:  Body mass index is 29.08 kg/m.  Estimated Nutritional Needs:   Kcal:  1900-2100  Protein:  90-100 g  Fluid:  2.2 L  EDUCATION NEEDS:   Education needs addressed 11/10/16 GDM educ  Elisabeth CaraKatherine Edyn Qazi M.Odis LusterEd. R.D. LDN Neonatal Nutrition Support Specialist/RD III Pager [phone]      Phone [phone]

## 2016-12-19 ENCOUNTER — Inpatient Hospital Stay (HOSPITAL_COMMUNITY): Payer: Medicaid Other | Admitting: Anesthesiology

## 2016-12-19 ENCOUNTER — Encounter (HOSPITAL_COMMUNITY)
Admission: AD | Disposition: A | Discharge status: Home / Self Care | Payer: Self-pay | Source: Ambulatory Visit | Attending: Obstetrics and Gynecology

## 2016-12-19 ENCOUNTER — Encounter (HOSPITAL_COMMUNITY): Payer: Self-pay | Admitting: *Deleted

## 2016-12-19 DIAGNOSIS — O321XX1 Maternal care for breech presentation, fetus 1: Secondary | ICD-10-CM

## 2016-12-19 DIAGNOSIS — O321XX2 Maternal care for breech presentation, fetus 2: Secondary | ICD-10-CM

## 2016-12-19 DIAGNOSIS — Z3A25 25 weeks gestation of pregnancy: Secondary | ICD-10-CM

## 2016-12-19 DIAGNOSIS — O42112 Preterm premature rupture of membranes, onset of labor more than 24 hours following rupture, second trimester: Secondary | ICD-10-CM

## 2016-12-19 LAB — TYPE AND SCREEN
ABO/RH(D): A POS
ANTIBODY SCREEN: NEGATIVE

## 2016-12-19 LAB — GC/CHLAMYDIA PROBE AMP (~~LOC~~) NOT AT ARMC
Chlamydia: NEGATIVE
Neisseria Gonorrhea: NEGATIVE

## 2016-12-19 LAB — GLUCOSE, CAPILLARY
GLUCOSE-CAPILLARY: 79 mg/dL (ref 65–99)
Glucose-Capillary: 78 mg/dL (ref 65–99)
Glucose-Capillary: 67 mg/dL (ref 65–99)

## 2016-12-19 LAB — KLEIHAUER-BETKE STAIN
# VIALS RHIG: 1
FETAL CELLS %: 0 %
QUANTITATION FETAL HEMOGLOBIN: 0 mL

## 2016-12-19 SURGERY — Surgical Case
Anesthesia: Spinal | Site: Abdomen | Wound class: Clean Contaminated

## 2016-12-19 MED ORDER — OXYCODONE HCL 5 MG PO TABS
10.0000 mg | ORAL_TABLET | ORAL | Status: DC | PRN
  Administered 2016-12-19 – 2016-12-22 (×9): 10 mg via ORAL
  Filled 2016-12-19 (×9): qty 2

## 2016-12-19 MED ORDER — MORPHINE SULFATE (PF) 0.5 MG/ML IJ SOLN
INTRAMUSCULAR | Status: AC
  Filled 2016-12-19: qty 10

## 2016-12-19 MED ORDER — OXYCODONE HCL 5 MG PO TABS: 5.0000 mg | ORAL_TABLET | ORAL | Status: DC | PRN

## 2016-12-19 MED ORDER — ONDANSETRON HCL 4 MG/2ML IJ SOLN
INTRAMUSCULAR | Status: DC | PRN
  Administered 2016-12-19: 4 mg via INTRAVENOUS

## 2016-12-19 MED ORDER — NALBUPHINE HCL 10 MG/ML IJ SOLN: 5.0000 mg | INTRAMUSCULAR | Status: DC | PRN

## 2016-12-19 MED ORDER — CEFAZOLIN SODIUM-DEXTROSE 2-4 GM/100ML-% IV SOLN
INTRAVENOUS | Status: AC
  Filled 2016-12-19: qty 100

## 2016-12-19 MED ORDER — DIBUCAINE 1 % RE OINT: 1.0000 "application " | TOPICAL_OINTMENT | RECTAL | Status: DC | PRN

## 2016-12-19 MED ORDER — DEXAMETHASONE SODIUM PHOSPHATE 4 MG/ML IJ SOLN
INTRAMUSCULAR | Status: AC
  Filled 2016-12-19: qty 1

## 2016-12-19 MED ORDER — WITCH HAZEL-GLYCERIN EX PADS: 1.0000 "application " | MEDICATED_PAD | CUTANEOUS | Status: DC | PRN

## 2016-12-19 MED ORDER — COCONUT OIL OIL: 1.0000 "application " | TOPICAL_OIL | Status: DC | PRN

## 2016-12-19 MED ORDER — TETANUS-DIPHTH-ACELL PERTUSSIS 5-2.5-18.5 LF-MCG/0.5 IM SUSP
0.5000 mL | Freq: Once | INTRAMUSCULAR | Status: AC
  Administered 2016-12-22: 0.5 mL via INTRAMUSCULAR
  Filled 2016-12-19: qty 0.5

## 2016-12-19 MED ORDER — LACTATED RINGERS IV SOLN
INTRAVENOUS | Status: DC
Start: 2016-12-19 — End: 2016-12-19

## 2016-12-19 MED ORDER — NALBUPHINE HCL 10 MG/ML IJ SOLN: 5.0000 mg | Freq: Once | INTRAMUSCULAR | Status: DC | PRN

## 2016-12-19 MED ORDER — KETOROLAC TROMETHAMINE 30 MG/ML IJ SOLN
30.0000 mg | Freq: Four times a day (QID) | INTRAMUSCULAR | Status: AC | PRN
  Administered 2016-12-19: 30 mg via INTRAMUSCULAR

## 2016-12-19 MED ORDER — METOCLOPRAMIDE HCL 5 MG/ML IJ SOLN: 10.0000 mg | Freq: Once | INTRAMUSCULAR | Status: DC | PRN

## 2016-12-19 MED ORDER — BUPIVACAINE IN DEXTROSE 0.75-8.25 % IT SOLN
INTRATHECAL | Status: DC | PRN
  Administered 2016-12-19: 1.2 mL via INTRATHECAL

## 2016-12-19 MED ORDER — OXYTOCIN 40 UNITS IN LACTATED RINGERS INFUSION - SIMPLE MED: 2.5000 [IU]/h | INTRAVENOUS | Status: AC

## 2016-12-19 MED ORDER — MEPERIDINE HCL 25 MG/ML IJ SOLN: 6.2500 mg | INTRAMUSCULAR | Status: DC | PRN

## 2016-12-19 MED ORDER — IBUPROFEN 600 MG PO TABS
600.0000 mg | ORAL_TABLET | Freq: Four times a day (QID) | ORAL | Status: DC | PRN
Start: 2016-12-20 — End: 2016-12-22
  Administered 2016-12-22: 600 mg via ORAL
  Filled 2016-12-19: qty 1

## 2016-12-19 MED ORDER — FERROUS SULFATE 325 (65 FE) MG PO TABS
325.0000 mg | ORAL_TABLET | Freq: Every day | ORAL | Status: DC
  Administered 2016-12-20 – 2016-12-22 (×3): 325 mg via ORAL
  Filled 2016-12-19 (×3): qty 1

## 2016-12-19 MED ORDER — ACETAMINOPHEN 325 MG PO TABS: 650.0000 mg | ORAL_TABLET | ORAL | Status: DC | PRN

## 2016-12-19 MED ORDER — LACTATED RINGERS IV SOLN
INTRAVENOUS | Status: DC | PRN
  Administered 2016-12-19: 40 [IU] via INTRAVENOUS

## 2016-12-19 MED ORDER — FENTANYL CITRATE (PF) 100 MCG/2ML IJ SOLN: 25.0000 ug | INTRAMUSCULAR | Status: DC | PRN

## 2016-12-19 MED ORDER — LACTATED RINGERS IV SOLN
INTRAVENOUS | Status: DC | PRN
  Administered 2016-12-19 (×2): via INTRAVENOUS

## 2016-12-19 MED ORDER — FENTANYL CITRATE (PF) 100 MCG/2ML IJ SOLN
INTRAMUSCULAR | Status: AC
  Filled 2016-12-19: qty 2

## 2016-12-19 MED ORDER — OXYTOCIN 10 UNIT/ML IJ SOLN
INTRAMUSCULAR | Status: AC
  Filled 2016-12-19: qty 4

## 2016-12-19 MED ORDER — FENTANYL CITRATE (PF) 100 MCG/2ML IJ SOLN
INTRAMUSCULAR | Status: DC | PRN
  Administered 2016-12-19: 10 ug via INTRATHECAL

## 2016-12-19 MED ORDER — DEXAMETHASONE SODIUM PHOSPHATE 4 MG/ML IJ SOLN
INTRAMUSCULAR | Status: DC | PRN
  Administered 2016-12-19: 4 mg via INTRAVENOUS

## 2016-12-19 MED ORDER — CEFAZOLIN SODIUM-DEXTROSE 2-3 GM-% IV SOLR
INTRAVENOUS | Status: DC | PRN
  Administered 2016-12-19: 2 g via INTRAVENOUS

## 2016-12-19 MED ORDER — DIPHENHYDRAMINE HCL 25 MG PO CAPS
25.0000 mg | ORAL_CAPSULE | ORAL | Status: DC | PRN
  Filled 2016-12-19: qty 1

## 2016-12-19 MED ORDER — LACTATED RINGERS IV SOLN
INTRAVENOUS | Status: DC | PRN
  Administered 2016-12-19: 11:00:00 via INTRAVENOUS

## 2016-12-19 MED ORDER — NALOXONE HCL 0.4 MG/ML IJ SOLN: 0.4000 mg | INTRAMUSCULAR | Status: DC | PRN

## 2016-12-19 MED ORDER — KETOROLAC TROMETHAMINE 30 MG/ML IJ SOLN: 30.0000 mg | Freq: Four times a day (QID) | INTRAMUSCULAR | Status: AC | PRN

## 2016-12-19 MED ORDER — SCOPOLAMINE 1 MG/3DAYS TD PT72
MEDICATED_PATCH | TRANSDERMAL | Status: DC | PRN
  Administered 2016-12-19: 1 via TRANSDERMAL

## 2016-12-19 MED ORDER — SCOPOLAMINE 1 MG/3DAYS TD PT72
MEDICATED_PATCH | TRANSDERMAL | Status: AC
  Filled 2016-12-19: qty 1

## 2016-12-19 MED ORDER — MORPHINE SULFATE (PF) 0.5 MG/ML IJ SOLN
INTRAMUSCULAR | Status: DC | PRN
  Administered 2016-12-19: .2 mg via INTRATHECAL

## 2016-12-19 MED ORDER — ONDANSETRON HCL 4 MG/2ML IJ SOLN: 4.0000 mg | Freq: Three times a day (TID) | INTRAMUSCULAR | Status: DC | PRN

## 2016-12-19 MED ORDER — NALOXONE HCL 2 MG/2ML IJ SOSY
1.0000 ug/kg/h | PREFILLED_SYRINGE | INTRAVENOUS | Status: DC | PRN
  Filled 2016-12-19: qty 2

## 2016-12-19 MED ORDER — SODIUM CHLORIDE 0.9% FLUSH: 3.0000 mL | INTRAVENOUS | Status: DC | PRN

## 2016-12-19 MED ORDER — PHENYLEPHRINE 8 MG IN D5W 100 ML (0.08MG/ML) PREMIX OPTIME
INJECTION | INTRAVENOUS | Status: DC | PRN
  Administered 2016-12-19: 60 ug/min via INTRAVENOUS

## 2016-12-19 MED ORDER — LACTATED RINGERS IV SOLN
INTRAVENOUS | Status: DC
  Administered 2016-12-20: via INTRAVENOUS

## 2016-12-19 MED ORDER — MENTHOL 3 MG MT LOZG: 1.0000 | LOZENGE | OROMUCOSAL | Status: DC | PRN

## 2016-12-19 MED ORDER — PHENYLEPHRINE HCL 10 MG/ML IJ SOLN
INTRAMUSCULAR | Status: DC | PRN
  Administered 2016-12-19: 40 ug via INTRAVENOUS
  Administered 2016-12-19: 80 ug via INTRAVENOUS

## 2016-12-19 MED ORDER — SOD CITRATE-CITRIC ACID 500-334 MG/5ML PO SOLN
30.0000 mL | Freq: Once | ORAL | Status: AC
  Administered 2016-12-19: 30 mL via ORAL

## 2016-12-19 MED ORDER — SOD CITRATE-CITRIC ACID 500-334 MG/5ML PO SOLN
ORAL | Status: AC
Start: 2016-12-19 — End: 2016-12-19
  Administered 2016-12-19: 30 mL via ORAL
  Filled 2016-12-19: qty 15

## 2016-12-19 MED ORDER — POLYETHYLENE GLYCOL 3350 17 G PO PACK
17.0000 g | PACK | Freq: Every day | ORAL | Status: DC
  Administered 2016-12-20 – 2016-12-22 (×3): 17 g via ORAL
  Filled 2016-12-19 (×3): qty 1

## 2016-12-19 MED ORDER — CEFAZOLIN SODIUM-DEXTROSE 2-4 GM/100ML-% IV SOLN: 2.0000 g | INTRAVENOUS | Status: DC

## 2016-12-19 MED ORDER — SIMETHICONE 80 MG PO CHEW
80.0000 mg | CHEWABLE_TABLET | Freq: Three times a day (TID) | ORAL | Status: DC
  Administered 2016-12-19 – 2016-12-22 (×6): 80 mg via ORAL
  Filled 2016-12-19 (×6): qty 1

## 2016-12-19 MED ORDER — SCOPOLAMINE 1 MG/3DAYS TD PT72: 1.0000 | MEDICATED_PATCH | Freq: Once | TRANSDERMAL | Status: DC

## 2016-12-19 MED ORDER — SOD CITRATE-CITRIC ACID 500-334 MG/5ML PO SOLN: 30.0000 mL | ORAL | Status: DC

## 2016-12-19 MED ORDER — KETOROLAC TROMETHAMINE 30 MG/ML IJ SOLN
INTRAMUSCULAR | Status: AC
  Filled 2016-12-19: qty 1

## 2016-12-19 MED ORDER — ONDANSETRON HCL 4 MG/2ML IJ SOLN
INTRAMUSCULAR | Status: AC
  Filled 2016-12-19: qty 2

## 2016-12-19 MED ORDER — PRENATAL MULTIVITAMIN CH: 1.0000 | ORAL_TABLET | Freq: Every day | ORAL | Status: DC

## 2016-12-19 MED ORDER — SODIUM CHLORIDE 0.45 % IV BOLUS
250.0000 mL | Freq: Once | INTRAVENOUS | Status: AC
  Administered 2016-12-19: 250 mL via INTRAVENOUS

## 2016-12-19 MED ORDER — DIPHENHYDRAMINE HCL 25 MG PO CAPS: 25.0000 mg | ORAL_CAPSULE | Freq: Four times a day (QID) | ORAL | Status: DC | PRN

## 2016-12-19 MED ORDER — BUPIVACAINE IN DEXTROSE 0.75-8.25 % IT SOLN
INTRATHECAL | Status: AC
  Filled 2016-12-19: qty 2

## 2016-12-19 MED ORDER — DIPHENHYDRAMINE HCL 50 MG/ML IJ SOLN: 12.5000 mg | INTRAMUSCULAR | Status: DC | PRN

## 2016-12-19 MED ORDER — ZOLPIDEM TARTRATE 5 MG PO TABS: 5.0000 mg | ORAL_TABLET | Freq: Every evening | ORAL | Status: DC | PRN

## 2016-12-19 SURGICAL SUPPLY — 31 items
BENZOIN TINCTURE PRP APPL 2/3 (GAUZE/BANDAGES/DRESSINGS) ×4 IMPLANT
CANISTER SUCT 3000ML PPV (MISCELLANEOUS) ×4 IMPLANT
CHLORAPREP W/TINT 26ML (MISCELLANEOUS) ×4 IMPLANT
CLOSURE STERI STRIP 1/2 X4 (GAUZE/BANDAGES/DRESSINGS) ×4 IMPLANT
DERMABOND ADVANCED (GAUZE/BANDAGES/DRESSINGS) ×2
DERMABOND ADVANCED .7 DNX12 (GAUZE/BANDAGES/DRESSINGS) ×2 IMPLANT
DRSG OPSITE POSTOP 4X10 (GAUZE/BANDAGES/DRESSINGS) ×4 IMPLANT
ELECT REM PT RETURN 9FT ADLT (ELECTROSURGICAL) ×4
ELECTRODE REM PT RTRN 9FT ADLT (ELECTROSURGICAL) ×2 IMPLANT
GLOVE BIOGEL PI IND STRL 7.0 (GLOVE) ×4 IMPLANT
GLOVE BIOGEL PI IND STRL 7.5 (GLOVE) ×2 IMPLANT
GLOVE BIOGEL PI INDICATOR 7.0 (GLOVE) ×4
GLOVE BIOGEL PI INDICATOR 7.5 (GLOVE) ×2
GLOVE SKINSENSE NS SZ7.0 (GLOVE) ×2
GLOVE SKINSENSE STRL SZ7.0 (GLOVE) ×2 IMPLANT
GOWN STRL REUS W/ TWL LRG LVL3 (GOWN DISPOSABLE) ×4 IMPLANT
GOWN STRL REUS W/ TWL XL LVL3 (GOWN DISPOSABLE) ×2 IMPLANT
GOWN STRL REUS W/TWL LRG LVL3 (GOWN DISPOSABLE) ×4
GOWN STRL REUS W/TWL XL LVL3 (GOWN DISPOSABLE) ×2
NS IRRIG 1000ML POUR BTL (IV SOLUTION) ×4 IMPLANT
PACK C SECTION WH (CUSTOM PROCEDURE TRAY) ×4 IMPLANT
PAD OB MATERNITY 4.3X12.25 (PERSONAL CARE ITEMS) ×4 IMPLANT
PAD PREP 24X48 CUFFED NSTRL (MISCELLANEOUS) ×4 IMPLANT
SUT MON AB 4-0 PS1 27 (SUTURE) ×4 IMPLANT
SUT MON AB-0 CT1 36 (SUTURE) ×16 IMPLANT
SUT PLAIN 2 0 (SUTURE) ×2
SUT PLAIN 2 0 XLH (SUTURE) ×8 IMPLANT
SUT PLAIN ABS 2-0 CT1 27XMFL (SUTURE) ×2 IMPLANT
SUT VIC AB 0 CT1 36 (SUTURE) ×8 IMPLANT
SUT VIC AB 3-0 CT1 27 (SUTURE) ×2
SUT VIC AB 3-0 CT1 TAPERPNT 27 (SUTURE) ×2 IMPLANT

## 2016-12-19 NOTE — Transfer of Care (Signed)
Immediate Anesthesia Transfer of Care Note  Patient: Haley Griffin  Procedure(s) Performed: Procedure(s): CESAREAN SECTION MULTI-GESTATIONAL (N/A)  Patient Location: PACU  Anesthesia Type:Spinal  Level of Consciousness: awake, alert  and oriented  Airway & Oxygen Therapy: Patient Spontanous Breathing  Post-op Assessment: Report given to RN and Post -op Vital signs reviewed and stable  Post vital signs: Reviewed and stable  Last Vitals:  Vitals:   12/19/16 0613 12/19/16 0746  BP: (!) 99/59 98/64  Pulse: 91 98  Resp: 16 16  Temp: 36.6 C 36.4 C    Last Pain:  Vitals:   12/19/16 0746  TempSrc: Oral  PainSc:       Patients Stated Pain Goal: 2 (12/16/16 1712)  Complications: No apparent anesthesia complications

## 2016-12-19 NOTE — Op Note (Signed)
Operative Note   SURGERY DATE: 12/19/2016  PRE-OP DIAGNOSIS:  *Dichorionic-Diamniotic pregnancy at 25/3 weeks  *Preterm labor (8cm dilation) *PPROM fetus A *GDMA1 *Chronic HTN  POST-OP DIAGNOSIS: Same. Delivered   PROCEDURE: Urgent primary low transverse cesarean section via pfannenstiel skin incision with double layer uterine closure  SURGEON: Surgeon(s) and Role:    * Salvisa Bingharlie Ayano Douthitt, MD - Primary  ASSISTANT: Ernestina PennaNicholas Schenk, MD   ANESTHESIA: spinal  ESTIMATED BLOOD LOSS: 400mL  DRAINS: 300mL UOP via indwelling foley  TOTAL IV FLUIDS: 2200mL crystalloid  VTE PROPHYLAXIS: SCDs to bilateral lower extremities  ANTIBIOTICS: Two grams of Cefazolin were given., within 1 hour of skin incision  SPECIMENS: placenta to pathology  COMPLICATIONS: none  FINDINGS: No intra-abdominal adhesions were noted. Grossly normal uterus, tubes and ovaries.  Fetus A:  630gm, no amniotic fluid, cephalic, female, APGARs 5/8, intact placenta Arterial: unable to obtain Venous:Results for Vinson MoselleSIU, GIRLA Dorcus (MRN [id number]) as of 12/20/2016 16:14  Ref. Range 12/19/2016 11:30  Bicarbonate Latest Ref Range: 13.0 - 22.0 mmol/L 22.1 (H)  Ph Cord Blood (Venous) Latest Ref Range: 7.240 - 7.380  7.380  pCO2 Cord Blood (Venous) Latest Ref Range: 42.0 - 56.0  38.3 (L)   Fetus B: 650gmclear amniotic fluid, cephalic, female, APGARs 5/7, intact placenta Results for Rosary LivelySIU, GIRLB Aamira (MRN [id number]) as of 12/20/2016 16:15  Ref. Range 12/19/2016 11:30 12/19/2016 11:30  pH cord blood (arterial) Latest Ref Range: 7.210 - 7.380  7.311   pCO2 cord blood (arterial) Latest Ref Range: 42.0 - 56.0 mmHg 47.7   Bicarbonate Latest Ref Range: 13.0 - 22.0 mmol/L 23.4 (H) 22.1 (H)  Ph Cord Blood (Venous) Latest Ref Range: 7.240 - 7.380   7.343  pCO2 Cord Blood (Venous) Latest Ref Range: 42.0 - 56.0   41.8 (L)    PROCEDURE IN DETAIL: The patient was taken to the operating room where anesthesia was administered and normal fetal heart tones  were confirmed. She was then prepped and draped in the normal fashion in the dorsal supine position with a leftward tilt.  After a time out was performed, a pfannensteil skin incision was made with the scalpel and carried through to the underlying layer of fascia. The fascia was then incised at the midline and this incision was extended laterally with the mayo scissors. Attention was turned to the superior aspect of the fascial incision which was grasped with the kocher clamps x 2, tented up and the rectus muscles were dissected off with the bovie. In a similar fashion the inferior aspect of the fascial incision was grasped with the kocher clamps, tented up and the rectus muscles dissected off with the mayo scissors. The rectus muscles were then separated in the midline and the peritoneum was entered bluntly. The bladder blade was inserted and the vesicouterine peritoneum was identified, tented up and entered with the metzenbaum scissors. This incision was extended laterally and the bladder flap was created digitally. The bladder blade was reinserted.  A low transverse hysterotomy was made with the scalpel until the endometrial cavity was breached and extended bluntly and fetus A was delivered breech using the standard maneuvers (no AF noted).  The cord was clamped and cut immediately and handed to the NICU team. Next, fetus B's amniotic sac was ruptured with the Allis clamp, yielding clear amniotic fluid.  The fetus was then delivered breech using the standard maneuvers. The cord was clamped x 2 and cut, and the infant was immediately handed to the NICU team.  The placenta  was then gradually expressed from the uterus and then the uterus was exteriorized and cleared of all clots and debris. The hysterotomy was repaired with a running suture of 1-0 vicryl. A second imbricating layer of 1-0 vicryl suture was then placed. Several figure-of-eight sutures of 1-0 monocryl were added to achieve excellent hemostasis.    The uterus and adnexa were then returned to the abdomen, and the hysterotomy and all operative sites were reinspected and excellent hemostasis was noted after irrigation and suction of the abdomen with warm saline.  The peritoneum was closed with a running stitch of 3-0 Vicryl. The fascia was reapproximated with 0 Vicryl in a simple running fashion bilaterally. The subcutaneous layer was then reapproximated with interrupted sutures of 2-0 plain gut, and the skin was then closed with 4-0 monocryl, in a subcuticular fashion.  The patient  tolerated the procedure well. Sponge, lap, needle, and instrument counts were correct x 2. The patient was transferred to the recovery room awake, alert and breathing independently in stable condition.  Cornelia Copa MD Attending Center for [facility])

## 2016-12-19 NOTE — Progress Notes (Signed)
OB Note With iPad vietnamese interpreter. Feels pains q4959m, some VB when she got up to go to the bathroom. Pt only toco and contracting q5959m and looks comfortable  SVE 8/90/+2/cephalic.   Pt to be attached to EFM and move to OR for urgent delivery. D/w husband and pt and recommend c-section given extreme prematurity and risk of 2nd fetus going breech, head entrapment; surgical risks d/w them and they are amenable to this.  s/p BMZ on 5/1 and 5/2 and Mg  NICU and OR aware  Cornelia Copaharlie Teighan Aubert, Jr MD Attending Center [facility] 12/19/2016 Time: 1051am

## 2016-12-19 NOTE — Anesthesia Postprocedure Evaluation (Signed)
Anesthesia Post Note  Patient: Haley Griffin  Procedure(s) Performed: Procedure(s) (LRB): CESAREAN SECTION MULTI-GESTATIONAL (N/A)  Patient location during evaluation: [facility] Unit Anesthesia Type: Spinal Level of consciousness: oriented and awake and alert Pain management: pain level controlled Vital Signs Assessment: post-procedure vital signs reviewed and stable Respiratory status: spontaneous breathing and respiratory function stable Cardiovascular status: blood pressure returned to baseline and stable Postop Assessment: no headache and no backache Anesthetic complications: no        Last Vitals:  Vitals:   12/19/16 1538 12/19/16 1635  BP: 99/61 97/63  Pulse: 66 74  Resp: 14 16  Temp: 36.8 C 37 C    Last Pain:  Vitals:   12/19/16 1538  TempSrc: Oral  PainSc:    Pain Goal: Patients Stated Pain Goal: 2 (12/16/16 1712)               Junious SilkGILBERT,Rahul Malinak

## 2016-12-19 NOTE — Anesthesia Preprocedure Evaluation (Signed)
Anesthesia Evaluation  Patient identified by MRN, date of birth, ID band Patient awake    Reviewed: Allergy & Precautions, NPO status , Patient's Chart, lab work & pertinent test results  Airway Mallampati: II  TM Distance: >3 FB Neck ROM: Full    Dental no notable dental hx.    Pulmonary neg pulmonary ROS,    Pulmonary exam normal breath sounds clear to auscultation       Cardiovascular hypertension, Normal cardiovascular exam Rhythm:Regular Rate:Normal     Neuro/Psych negative neurological ROS  negative psych ROS   GI/Hepatic negative GI ROS, Neg liver ROS,   Endo/Other  diabetes  Renal/GU negative Renal ROS  negative genitourinary   Musculoskeletal negative musculoskeletal ROS (+)   Abdominal   Peds negative pediatric ROS (+)  Hematology negative hematology ROS (+)   Anesthesia Other Findings   Reproductive/Obstetrics (+) Pregnancy                             Anesthesia Physical Anesthesia Plan  ASA: II and emergent  Anesthesia Plan: Spinal   Post-op Pain Management:    Induction:   Airway Management Planned: Natural Airway  Additional Equipment:   Intra-op Plan:   Post-operative Plan:   Informed Consent: I have reviewed the patients History and Physical, chart, labs and discussed the procedure including the risks, benefits and alternatives for the proposed anesthesia with the patient or authorized representative who has indicated his/her understanding and acceptance.   Dental advisory given  Plan Discussed with: CRNA  Anesthesia Plan Comments: (Twins, in active labor. Urgent. SAB)        Anesthesia Quick Evaluation

## 2016-12-19 NOTE — Lactation Note (Signed)
This note was copied from a baby's chart. Lactation Consultation Note  Patient Name: Haley Griffin WUJWJ'XToday's Date: 12/19/2016 Reason for consult: Initial assessment;Multiple gestation;Infant < 6lbs;NICU baby   with this experienced breast feeding mom of 25 weeks twins, now 3 hours old. Mom had already pumped and hand expressed in the PACU, and was able to collect about 1 ml of colostrum. I taught mom how to hand express, and with my help, she expressed an additional 1 ml of colostrum. Mom plans to pump while the babies are in the NICU, and eventually change to formula. Through the [employer] interpreter, Joaquin BendLek Dubeau, I was able to teach both mom and dad about pumping, hand expression, and part care, and lactation servcies. Mom is very tired, and will need teaching reinforced, but appeared to understands very well. Mom understands some English, and dad understands and speaks  and reads some english. Dad was shown how to care for pump parts, and how to set pump in initiaition setting. Breast care was reviewed, EBM to nipples, and mom's nurse was bringing coconut oil and q-tips. Mom  reports having a good supply with her other children.  [place] [facility] [place] [facility] and a fax was sent for mom to receive a DEP. Mom knows to have her nurse call for lactation as needed.      Maternal Data Formula Feeding for Exclusion: Yes (premature twins in NICU) Has patient been taught Hand Expression?: Yes Does the patient have breastfeeding experience prior to this delivery?: Yes  Feeding    LATCH Score/Interventions                      Lactation Tools Discussed/Used WIC Program: [place] [facility] Pump Review: Setup, frequency, and cleaning;Milk Storage;Other (comment) (hand expression, pump settings and use, lactation services) Initiated by:: Toron Bowring Nedra HaiLee, Rn IBCLC Date initiated:: 12/19/16   Consult Status Consult Status: Follow-up Date: 12/20/16 Follow-up  type: In-patient    Alfred LevinsLee, Stephano Arrants Anne 12/19/2016, 3:26 PM

## 2016-12-19 NOTE — Anesthesia Procedure Notes (Signed)
Spinal  Patient location during procedure: OR Staffing Anesthesiologist: Haji Delaine Performed: anesthesiologist  Preanesthetic Checklist Completed: patient identified, site marked, surgical consent, pre-op evaluation, timeout performed, IV checked, risks and benefits discussed and monitors and equipment checked Spinal Block Patient position: sitting Prep: DuraPrep Patient monitoring: heart rate, continuous pulse ox and blood pressure Approach: midline Location: L3-4 Injection technique: single-shot Needle Needle type: Sprotte  Needle gauge: 24 G Needle length: 9 cm Additional Notes Expiration date of kit checked and confirmed. Patient tolerated procedure well, without complications.       

## 2016-12-20 LAB — CBC
HEMATOCRIT: 26.1 % — AB (ref 36.0–46.0)
HEMOGLOBIN: 8.3 g/dL — AB (ref 12.0–15.0)
MCH: 23.3 pg — AB (ref 26.0–34.0)
MCHC: 31.8 g/dL (ref 30.0–36.0)
MCV: 73.3 fL — AB (ref 78.0–100.0)
Platelets: 169 10*3/uL (ref 150–400)
RBC: 3.56 MIL/uL — ABNORMAL LOW (ref 3.87–5.11)
RDW: 14.2 % (ref 11.5–15.5)
WBC: 11.4 10*3/uL — ABNORMAL HIGH (ref 4.0–10.5)

## 2016-12-20 NOTE — Progress Notes (Signed)
Patient ID: Maryjean MornYip Cermak, female   DOB: 11/10/1985, 31 y.o.   MRN: [id number]  POSTPARTUM PROGRESS NOTE  Post Op/Partum Day #1 Subjective:  Maryjean MornYip Keisler is a 31 y.o. [id number] 5621w3d s/p PLTCS 2/2 twins in active labor with malpresentation.  Live interpretor present in room. No acute events overnight.  Pt has not attempted ambulating yet, foley still in place, tolerating soup.  She denies nausea or vomiting.  Pain is poorly controlled, did not know to ask for pain medicine.  She has had flatus. She has not had bowel movement.  Lochia small.   Discussed with patient how to ask for pain control and expectations for this afternoon, such as removal of foley catheter, ambulation, eating, etc. Patient and husband understand. To order lunch now. Patient illicits some dizziness, VSS and Hb stable after cesarean, but has not been eating since surgery.   Objective: Blood pressure 104/61, pulse 78, temperature 98.2 F (36.8 C), resp. rate 18, height 4\' 11"  (1.499 m), weight 144 lb (65.3 kg), last menstrual period 06/16/2016, SpO2 97 %, unknown if currently breastfeeding.  Physical Exam:  General: alert, cooperative and no distress Lochia:normal flow Chest: CTAB Heart: RRR no m/r/g Abdomen: +BS, soft, appropriate post-surgical tenderness; dressing is clean, dry with minimal area of old blood on honeycomb, intact.  Uterine Fundus: firm, below umbilicus DVT Evaluation: No calf swelling or tenderness Extremities: No edema   Recent Labs  12/20/16 0513  HGB 8.3*  HCT 26.1*    Assessment/Plan:  ASSESSMENT: Maryjean MornYip Mccartt is a 31 y.o. [id number] 5821w3d s/p PLTCS, POD#1  Breastfeeding, Lactation consult and Contraception Nexplanon.  Patient to get up and ambulate and attempt voiding on own after foley removed this afternoon.  Babys are in NICU.   LOS: 4 days   Jen MowElizabeth Mumaw, DO OB Fellow Center for [facility]  12/20/2016, 11:37 AM

## 2016-12-20 NOTE — Progress Notes (Signed)
Many efforts are being made to get an interpretor here to see patient.

## 2016-12-20 NOTE — Progress Notes (Signed)
Patient being taken to NICU as was stated baby B not doing well. Interpretor should be here soon.

## 2016-12-20 NOTE — Lactation Note (Signed)
This note was copied from a baby's chart. Lactation Consultation Note  Patient Name: Vinson MoselleGirlA Dmya Goodwill ZOXWR'UToday's Date: 12/20/2016 Reason for consult: Follow-up assessment;Multiple gestation;NICU baby (Wier H , Chasen - Interpreter present ( from [employer] [facility] for [employer] [place] ) )  [facility]. Dad also present.  Per mom has pumped x4 since [facility] and it has bee comfortable.  She has got'en small amount x 1. LC explained to mom and dad it a normal process.  LC encouraged mom to continue her pumping and work on increasing the amount of times to meet the goal of at least 8 times a day or more.  LC reviewed supply and demand.   Maternal Data    Feeding    LATCH Score/Interventions                      Lactation Tools Discussed/Used Tools: Pump (per mom pumped x 4 since LC yesterday with small amount pumped ) ) Breast pump type: Double-Electric Breast Pump   Consult Status Consult Status: Follow-up Date: 12/21/16 Follow-up type: In-patient    Matilde SprangMargaret Ann Mannie Ohlin 12/20/2016, 1:06 PM

## 2016-12-20 NOTE — Progress Notes (Signed)
Interpretor here to see Haley Griffin.

## 2016-12-21 NOTE — Lactation Note (Signed)
This note was copied from a baby's chart. Lactation Consultation Note  Patient Name: Haley Griffin ZOXWR'UToday's Date: 12/21/2016 Reason for consult: Follow-up assessment Babies at 47 hr of life and in the NICU. Mom is pumping. She denies breast or nipple pain, voiced no concerns. Discussed baby behavior, pumping frequency, breast changes, and nipple care. Parents are aware of lactation services and support group. Mom will pump 8-12x/24hr and transport the milk to the NICU.    Maternal Data    Feeding Feeding Type: Donor Breast Milk Length of feed: 30 min  LATCH Score/Interventions                      Lactation Tools Discussed/Used     Consult Status Consult Status: Follow-up Date: 12/22/16 Follow-up type: In-patient    [name] 12/21/2016, 11:13 AM

## 2016-12-21 NOTE — Progress Notes (Signed)
Subjective: [name] reports some incisional pain. Well controlled with medication but she admits that she didn't know to ask the nurse for it. She is ambulated, tolerating a regular diet and voiding.    Objective: Vital signs in last 24 hours: Temp:  [98.2 F (36.8 C)-98.5 F (36.9 C)] 98.4 F (36.9 C) (05/06 0830) Pulse Rate:  [73-87] 87 (05/06 0830) Resp:  [16-20] 18 (05/06 0830) BP: (101-105)/(58-70) 105/70 (05/06 0830) SpO2:  [95 %-100 %] 99 % (05/06 0830)  Physical Exam:  General: alert, cooperative and no distress Lochia: appropriate Uterine Fundus: firm and non tender Incision: covered with honeycomb dressing which is minimally stained with dark blood DVT Evaluation: No evidence of DVT seen on physical exam. No cords or calf tenderness.   Recent Labs  12/20/16 0513  HGB 8.3*  HCT 26.1*    Assessment/Plan: Status post Cesarean section. Doing well postoperatively.  Continue current care Plan for discharge home tomorrow.  Haley Griffin 12/21/2016, 10:22 AM

## 2016-12-22 LAB — GLUCOSE, CAPILLARY: GLUCOSE-CAPILLARY: 71 mg/dL (ref 65–99)

## 2016-12-22 MED ORDER — FERROUS SULFATE 325 (65 FE) MG PO TABS: 325.0000 mg | ORAL_TABLET | Freq: Every day | ORAL | 1 refills | Status: DC

## 2016-12-22 MED ORDER — DOCUSATE SODIUM 100 MG PO CAPS: 100.0000 mg | ORAL_CAPSULE | Freq: Two times a day (BID) | ORAL | 0 refills | Status: DC

## 2016-12-22 MED ORDER — OXYCODONE-ACETAMINOPHEN 5-325 MG PO TABS: 1.0000 | ORAL_TABLET | ORAL | 0 refills | Status: DC | PRN

## 2016-12-22 MED ORDER — IBUPROFEN 600 MG PO TABS: 600.0000 mg | ORAL_TABLET | Freq: Four times a day (QID) | ORAL | 1 refills | Status: DC | PRN

## 2016-12-22 NOTE — Progress Notes (Addendum)
Patient and spouse given discharge instructions, follow-up appointments are in place and prescriptions are in hand. In person Interpreter utilized and signed discharge papers along with patient. Patient and FOB verbalizes understanding. PIH s/s and PP care reviewed. Patient and spouse have no concerns at this time.  Patient discharged with FOB and interpreter, will stop by the NICU to check on baby.

## 2016-12-22 NOTE — Discharge Summary (Signed)
OB Discharge Summary     Patient Name: Haley Griffin DOB: April 28, 1986 MRN: [id number]  Date of admission: 12/16/2016 Delivering MD:    Kaylanie, Capili [[id number]]  Taija, Mathias [[id number]]  [place]   Date of discharge: 12/22/2016  Admitting diagnosis: 25WKS RO ROM Intrauterine pregnancy: [redacted]w[redacted]d     Secondary diagnosis:  Active Problems:   Preterm premature rupture of membranes (PPROM) with unknown onset of labor  Additional problems: CHTN, A1GDM     Discharge diagnosis: Preterm Pregnancy Delivered, CHTN and GDM A1                                                                                                Post partum procedures:N/A  Augmentation: N/A  Complications: None  Hospital course:  Onset of Labor With Unplanned C/S  31 y.o. yo [id number] at [redacted]w[redacted]d was admitted in Latent Labor on 12/16/2016. Patient had a labor course significant for active labor with malpresentation. Membrane Rupture Time/Date:    Khiya, Friese [[id number]]  12:00 PM   Marlaya, Turck [[id number]]  11:59 AM ,   Sherley, Mckenney [[id number]]  12/16/2016   Laveyah, Oriol [[id number]]  12/19/2016   The patient went for cesarean section due to Malpresentation, and delivered two Viable female infants Details of operation can be found in separate operative note. Patient had an uncomplicated postpartum course.  She is ambulating,tolerating a regular diet, passing flatus, and urinating well.  Patient is discharged home in stable condition 12/22/16.  Physical exam  Vitals:   12/21/16 0005 12/21/16 0830 12/21/16 2000 12/22/16 0841  BP: 103/61 105/70 105/69 112/63  Pulse: 73 87 82 88  Resp: 18 18 18 18   Temp: 98.5 F (36.9 C) 98.4 F (36.9 C) 97.7 F (36.5 C) 98.4 F (36.9 C)  TempSrc: Oral Oral Oral Oral  SpO2: 100% 99% 99% 99%  Weight:      Height:       General: alert, cooperative and no distress Lochia: N/A Uterine Fundus: firm Incision: Healing well with no significant  drainage, honeycomb intact DVT Evaluation: No evidence of DVT seen on physical exam. Labs: Lab Results  Component Value Date   WBC 11.4 (H) 12/20/2016   HGB 8.3 (L) 12/20/2016   HCT 26.1 (L) 12/20/2016   MCV 73.3 (L) 12/20/2016   PLT 169 12/20/2016   CMP Latest Ref Rng & Units 11/19/2016  Glucose 65 - 99 mg/dL 38(V)  BUN 6 - 20 mg/dL 7  Creatinine 5.64 - 3.32 mg/dL 9.51(O)  Sodium 841 - 660 mmol/L 135  Potassium 3.5 - 5.2 mmol/L 4.0  Chloride 96 - 106 mmol/L 99  CO2 18 - 29 mmol/L 20  Calcium 8.7 - 10.2 mg/dL 9.3  Total Protein 6.0 - 8.5 g/dL 6.6  Total Bilirubin 0.0 - 1.2 mg/dL 0.4  Alkaline Phos 39 - 117 IU/L 67  AST 0 - 40 IU/L 15  ALT 0 - 32 IU/L 12    Discharge instruction: per After Visit Summary and "Baby and Me Booklet".  After visit meds:  Allergies as of 12/22/2016  No Known Allergies     Medication List    STOP taking these medications   ACCU-CHEK FASTCLIX LANCETS Misc   acetic acid-hydrocortisone otic solution Commonly known as:  VOSOL-HC   glucose blood test strip Commonly known as:  ACCU-CHEK GUIDE   PNV PO   triamcinolone 0.1 % paste Commonly known as:  KENALOG     TAKE these medications   aspirin EC 81 MG tablet Take 1 tablet (81 mg total) by mouth daily.   docusate sodium 100 MG capsule Commonly known as:  COLACE Take 1 capsule (100 mg total) by mouth 2 (two) times daily.   ferrous sulfate 325 (65 FE) MG tablet Take 1 tablet (325 mg total) by mouth daily with breakfast. Start taking on:  12/23/2016   ibuprofen 600 MG tablet Commonly known as:  ADVIL,MOTRIN Take 1 tablet (600 mg total) by mouth every 6 (six) hours as needed for headache, mild pain or cramping.   oxyCODONE-acetaminophen 5-325 MG tablet Commonly known as:  ROXICET Take 1 tablet by mouth every 4 (four) hours as needed for severe pain.       Diet: routine diet  Activity: Advance as tolerated. Pelvic rest for 6 weeks.   Outpatient follow up:6 weeks Follow up  Appt:Future Appointments Date Time Provider Department Center  01/20/2017 3:20 PM Marny LowensteinWenzel, Julie N, PA-C [facility]   Follow up Visit:No Follow-up on file.  Postpartum contraception: Combination OCPs  Newborn Data:   Vinson MoselleSiu, GirlA Ahava [[id number]][[id number]]  Live born female  Birth Weight: 1 lb 6.2 oz (630 g) APGAR: 5, 8   Rosary LivelySiu, GirlB Xenia [[id number]][[id number]]  Live born female  Birth Weight: 1 lb 6.9 oz (650 g) APGAR: 5, 7  Baby Feeding: Bottle and Breast Disposition:home with mother   12/22/2016 Lovena NeighboursAbdoulaye Diallo, MD   Midwife attestation I have seen and examined this patient and agree with above documentation in the resident's note.   Maryjean MornYip Treml is 31 y.o. [id number] s/p PCS.   Pain is well controlled.  Plan for birth control is oral progesterone-only contraceptive.  Method of Feeding: breast  PE:  Gen: well appearing Heart: reg rate Lungs: normal WOB Fundus firm Ext: soft, no pain, no edema Incision: honeycomb c/d/I; no erythema, edema, or drainage  Recent Labs  12/20/16 0513  HGB 8.3*  HCT 26.1*   Assessment POD #3 s/p PCS A1GDM, delivered-stable CHTN-stable  Plan: - discharge today - postpartum care discussed - f/u clinic in 6 weeks for postpartum visit - baby love consult for BP check later this week  Donette LarryMelanie Sitlali Koerner, CNM 12:16 PM

## 2016-12-22 NOTE — Lactation Note (Signed)
This note was copied from a baby's chart. Lactation Consultation Note  Patient Name: Rosary LivelyGirlB Britini Macho ZOXWR'UToday's Date: 12/22/2016 Reason for consult: Follow-up assessment;NICU baby;Infant < 6lbs;Multiple gestation;Pump rental   Follow up with mom of 71 hour NICU twins. Spoke with parents with assistance of [employer] Interpreter, EaklyLek Nihiser. Mom is pumping and just pumped about 4-5 oz of EBM. Enc dad to store in small amounts since infants are small to not waste large volumes. Colostrum collections containers and bottle for pumping given. Mom is aawre to pump every 2-3 hours for 15-20 minutes, changed mom to Maintenance setting on the pump since milk is in.   [facility] WIC phone # given and enc parents to call and make WIC appt for pump. WIC Loaner pump rental completed. Mom reports she has no questions/concerns at this time. Enc mom to call with any questions/concerns.    Maternal Data    Feeding    LATCH Score/Interventions                      Lactation Tools Discussed/Used Pump Review: Setup, frequency, and cleaning;Milk Storage Initiated by:: Reviewed   Consult Status Consult Status: PRN    Silas FloodSharon S Hice 12/22/2016, 11:39 AM

## 2016-12-22 NOTE — Discharge Instructions (Signed)
H??ng d?n ch?m [place] t?i nh dnh cho b m? (Home Care Instructions for Mom) HO?T ??NG  D?n tr? l?i t?t c? cc ho?t ??ng th??ng xuyn c?a qu v?.  ?? b?n thn qu v? ???c ngh? ng?i. Ch?p m?t khi con qu v? ng?.  Trnh nng b?t k? v?t g n?ng h?n 10 lb (4,5 kg) cho ??n khi chuyn gia ch?m [place] s?c kh?e ni c th? lm v?y.  Hessie Diener cc ho?t ??ng c?n nhi?u n? l?c v n?ng l??ng (?i h?i g?ng s?c) cho ??n khi chuyn gia ch?m [place] s?c kh?e ch?p thu?n. ?i l?i v?i t?c ?? ch?m ??n trung bnh th??ng l an ton.  N?u qu v? ?? m?:  Khng ht b?i, tro c?u thang ho?c li xe trong 4-6 tu?n.  Nh? ai ? gip qu v? ? nh cho ??n khi qu v? c?m th?y c th? t? lm cc vi?c bnh th??ng.  T?p th? d?c theo ch? d?n c?a chuyn gia ch?m [place] s?c kh?e, n?u ?i?u ny ???c p d?ng. CH?Y MU M ??O Qu v? c th? ti?p t?c ch?y mu trong 4-6 tu?n sau khi sinh con. Theo th?i gian, l??ng mu th??ng gi?m v mu mu s? nh?t h?n. Tuy nhin, dng mu mu ?? nh?t c th? t?ng ln n?u qu v? ho?t ??ng qu m?c. N?u qu v? c?n nhi?u h?n m?t b?ng v? sinh trong m?t gi? v b?ng b? th?m ??t, ho?c n?u qu v? ?i ti?u ra m?t c?c mu l?n:  N?m xu?ng.  Nng cao chn.  ??t b?ng p l?nh ln ph?n b?ng d??i c?a qu v?.  Ngh? ng?i.  G?i chuyn gia ch?m [place] s?c kh?e. N?u qu v? nui con b?ng s?a m?, qu v? s? c kinh nguy?t tr? l?i vo b?t k? lc no t? lc 8 tu?n sau khi sinh con ??n lc qu v? thi cho con b. N?u qu v? khng nui con b?ng s?a m?, qu v? s? c kinh nguy?t tr? l?i trong vng 6-8 tu?n sau khi sinh con. CH?M [place] VNG ?Y CH?U Vng ?y ch?u, ho?c ?y ch?u, l m?t b? ph?n c? th? n?m gi?a hai ?i c?a qu v?. Sau khi sinh con, vng ny c?n ch?m [place] ??c bi?t. Tun theo nh?ng h??ng d?n ny theo ch? d?n c?a chuyn gia ch?m [place] s?c kh?e.  T?m b?n n??c ?m trong 15-20 pht.  S? d?ng b?ng t?m thu?c v thu?c x?t v kem gi?m ?au theo ch? d?n.  Khng s? d?ng nt b?ng v? sinh ho?c th?t r?a cho ??n khi m ??o thi ch?y mu.  M?i l?n qu  v? vo nh t?m:  S? d?ng m?t chai phun n??c (peri bottle).  Thay b?ng v? sinh.  S? d?ng m?t kh?n ??t thay gi?y v? sinh cho ??n khi v?t khu c?a qu v? li?n l?i.  T?p bi t?p [place] m?i ngy. Bi t?p [place] gip duy tr cc c? h? tr? cho m ??o, bng quang v ru?t. Qu v? c th? t?p nh?ng bi t?p ny trong khi qu v? ??ng, ng?i hay n?m. ?? t?p cc bi t?p [place]:  Gi? ch?t cc c? ? b?ng v cc c? xung quanh ?ng d?n sinh c?a qu v?.  Gi? trong vi giy.  Th? l?ng.  L?p l?i cho ??n khi qu v? lm 5 l?n lin t?c.  ?? trnh b?nh tr? pht sinh ho?c n?ng thm:  U?ng ?? n??c ?? gi? cho n??c ti?u trong ho?c c mu vng nh?t.  Trnh r?n m?nh  khi ?i ??i ti?n.  Ch? s? d?ng thu?c khng c?n k ??n v thu?c lm m?m phn theo ch? d?n c?a chuyn gia ch?m [place] s?c kh?e. CH?M [place] NG?C  M?c o ng?c v?a kht.  Trnh dng thu?c gi?m ?au khng c?n k ??n ?? ?i?u tr? c?m gic kh ch?u ? ng?c.  Ch??m ? vo ng?c ?? gi?m c?m gic kh ch?u khi c?n thi?t:  Cho ? l?nh vo ti nh?a.  ?? kh?n t?m ? gi?a da v ti ch??m.  ?? ? l?nh trong kho?ng 20 pht ho?c theo ch? d?n c?a chuyn gia ch?m [place] s?c kh?e c?a qu v?. [facility] D??NG  p d?ng ch? ?? ?n u?ng cn b?ng.  Khng c? g?ng gi?m cn nhanh b?ng cch gi?m l??ng calo h?p th?.  U?ng vitamin dng tr??c khi sinh cho ??n khi khm s?c kh?e sau sinh ho?c cho ??n khi chuyn gia ch?m [place] s?c kh?e ni qu v? d?ng l?i. TR?M C?M SAU SINH Qu v? c th? th?y mnh khc m [employer] c nguyn nhn r rng v khng th? ??i ph v?i t?t c? nh?ng thay ??i x?y ra do m?i sinh con. Tm tr?ng ny ???c g?i l tr?m c?m sau khi sinh. Tr?m c?m sau khi sinh x?y ra v l??ng hoc mn c?a qu v? thay ??i sau khi sinh con. N?u qu v? b? tr?m c?m sau khi sinh, hy nh?n s? h? tr? c?a ch?ng, b?n b v gia ?nh. N?u tr?m c?m khng t? h?t sau vi tu?n, hy lin h? chuyn gia ch?m [place] s?c kh?e. T? KI?M TRA V T? ki?m tra v hng thng, vo cng th?i ?i?m trong thng. N?u qu v? cho con b s?a m?,  ki?m tra v ngay sau khi cho con b, khi v ? ?? c?ng. N?u qu v? cho con b s?a m? v qu v? b?t ??u c kinh nguy?t, hy ki?m tra v vo ngy 5, 6 ho?c 7 c?a k? kinh nguy?t. Bo co v? b?t k? kh?i u, c?c, hay ti?t d?ch no cho chuyn gia ch?m [place] s?c kh?e. Nn bi?t r?ng v th??ng c u c?c n?u qu v? ?ang cho con b. Vi?c ny l bnh th??ng v ? khng ph?i l m?t nguy c? v? s?c kh?e. Karle Plumber? THN M?T V TNH D?C Trnh quan h? tnh d?c trong t nh?t 3-4 tu?n sau khi sinh con ho?c cho ??n khi ch?t d?ch mu nu ?? ? m ??o h?t hon ton. N?u qu v? mu?n trnh [place], hy s? d?ng m?t s? d?ng trnh [place]. Qu v? c th? c thai sau khi sinh con, ngay c? khi qu v? ch?a th?y kinh nguy?t. ?I KHM N?U:  Qu v? c?m th?y khng th? ??i ph v?i nh?ng thay ??i m m?t ??a tr? mang l?i cho cu?c s?ng c?a qu v?, v nh?ng c?m gic ny khng h?t sau vi tu?n.  Qu v? th?y c u, c?c, ho?c ti?t d?ch ? v. NGAY L?P T?C ?I KHM N?U:  Mu lm ??t b?ng v? sinh trong th?i gian 1 gi? ho?c nhanh h?n.  Qu v? c:  ?au ho?c co th?t r?t nhi?u ? vng b?ng d??i.  Ti?t d?ch c mi kh ch?u ? m ??o.  S?t m khng ki?m sot ???c b?ng thu?c.  S?t v m?t vng trn v b? ?? v ?au.  ?au ho?c ?? ? b?p chn.  ?au ng?c ??t ng?t v r?t nhi?u.  Kh th?.  ?i ti?u ?au ho?c c mu.  Cc  v?n ?? v? th? l?c.  Qu v? nn trong 12 gi? ho?c lu h?n.  Qu v? b? ?au ??u d? d?i.  Qu v? c suy ngh? nghim tr?ng v? vi?c t? lm t?n th??ng mnh, con mnh ho?c b?t k? ng??i no khc. Thng tin ny khng nh?m m?c ?ch thay th? cho l?i khuyn m chuyn gia ch?m [place] s?c kh?e ni v?i qu v?. Hy b?o ??m qu v? ph?i th?o lu?n b?t k? v?n ?? g m qu v? c v?i chuyn gia ch?m [place] s?c kh?e c?a qu v?. Document Released: 01/22/2010 Document Revised: 11/26/2015 Document Reviewed: 02/05/2015 Elsevier Interactive Patient Education  2017 [employer]

## 2017-01-06 ENCOUNTER — Encounter: Payer: Medicaid Other | Admitting: Obstetrics and Gynecology

## 2017-01-20 ENCOUNTER — Ambulatory Visit (INDEPENDENT_AMBULATORY_CARE_PROVIDER_SITE_OTHER): Payer: Medicaid Other | Admitting: Advanced Practice Midwife

## 2017-01-20 DIAGNOSIS — Z8632 Personal history of gestational diabetes: Secondary | ICD-10-CM

## 2017-01-20 DIAGNOSIS — F4321 Adjustment disorder with depressed mood: Secondary | ICD-10-CM

## 2017-01-20 DIAGNOSIS — Z98891 History of uterine scar from previous surgery: Secondary | ICD-10-CM

## 2017-01-20 DIAGNOSIS — Z8751 Personal history of pre-term labor: Secondary | ICD-10-CM

## 2017-01-20 NOTE — Progress Notes (Signed)
Subjective:     Haley Griffin is a 31 y.o. female who presents for a postpartum visit. She is five weeks  postpartum following a delivery at 12/19/2016. I have fully reviewed the prenatal and intrapartum course. The delivery was at 25.[redacted] weeks gestational weeks. Outcome: low transverse c-section. Anesthesia: spinal. Postpartum course has been uncomplicated. Baby's A's course has been complicated by severe prematurity. Baby B dies on fifth day of life from complications of severe prematurity. Baby is feeding by breast. Bleeding not at this time. Bowel function is normal. Bladder function is normal. Patient is not  sexually active. Contraception method is nothing at this time but desire depo-provera. Postpartum depression screening:negative  The following portions of the patient's history were reviewed and updated as appropriate: allergies, current medications, past family history, past medical history, past social history, past surgical history and problem list.  Last Pap 2018, Nml  Live interpreter used.   Review of Systems Pertinent items are noted in HPI.  Also C/O low back pain since delivery   Objective:    There were no vitals taken for this visit.   General:  alert, cooperative, appears stated age, fatigued, no distress and flat affect   Breasts:  declined  Lungs: clear to auscultation bilaterally  Heart:  regular rate and rhythm, S1, S2 normal, no murmur, click, rub or gallop  Abdomen: soft, non-tender; bowel sounds normal; no masses,  no organomegaly and low-transverse incision well-healed   Vulva:  declined  Vagina: not evaluated  Cervix:  not examined  Corpus: not examined  Adnexa:  not evaluated  Rectal Exam: Not performed.                        Back: No CVAT. Nml ROM  Assessment:     Nml postpartum exam. Pap smear not done at today's visit.    Grief reaction - Declines visit w/ Haley Griffin. Notified pt of her services - Support given  MS Low back pain - Heat - IBU PRN Plan:     1. Contraception: Depo-Provera [facility]. [facility]. 2. Needs 2 hour GTT. Will check Price.  3. Follow up in: 2 weeks or as needed.

## 2017-01-20 NOTE — Patient Instructions (Addendum)

## 2017-01-22 ENCOUNTER — Encounter: Payer: Self-pay | Admitting: Advanced Practice Midwife

## 2017-01-22 DIAGNOSIS — Z98891 History of uterine scar from previous surgery: Secondary | ICD-10-CM | POA: Insufficient documentation

## 2017-01-29 ENCOUNTER — Other Ambulatory Visit: Payer: Medicaid Other

## 2017-02-26 NOTE — Anesthesia Postprocedure Evaluation (Signed)
Anesthesia Post Note  Patient: Haley Griffin  Procedure(s) Performed: Procedure(s) (LRB): CESAREAN SECTION MULTI-GESTATIONAL (N/A)     Anesthesia Post Evaluation  Last Vitals:  Vitals:   12/22/16 0841 12/22/16 1156  BP: 112/63 105/73  Pulse: 88 84  Resp: 18 17  Temp: 36.9 C 36.9 C    Last Pain:  Vitals:   12/22/16 1156  TempSrc: Oral  PainSc:                  Phillips Groutarignan, Brandi Tomlinson

## 2017-02-26 NOTE — Addendum Note (Signed)
Addendum  created 02/26/17 1431 by Phillips Groutarignan, Yessenia Maillet, MD   Sign clinical note

## 2017-03-14 ENCOUNTER — Ambulatory Visit: Payer: Self-pay

## 2017-03-14 NOTE — Lactation Note (Signed)
This note was copied from a baby's chart. Lactation Consultation Note  Patient Name: Vinson MoselleGirlA Whittley Defalco [id number]'s Date: 03/14/2017 Reason for consult: Follow-up assessment   With this mom of a NICU baby, now 2 months old, and 37 4/7 weeks CGa, and just over 5 lbs. The baby wanted to latch, but I think was looking for the firm bottle nipple. Mom has large nipples, so I fitted her with a 24 shield. The baby had a difficult time fitting the shield in her mouth. She suckled a few times, but was mostly nipple sucking, so shield was removed. Mom tried both cross cradle and football hold. Baby still on and off, fussy.   Mom has a rapid decrease in her milk supply. She is presently pumping about 5 times a day, thinking she will get more milk if she waits longer. I reviewed with mom supply and demand, and advised her to pump at least 8 times a day, if not more, . While mom had baby latched, I began expressing mom's opposite breast with a manual pump. By going back and forth to each breast, mom was able to collect about an ounce of milk. I suggested she try this at home, a kind of power pumping.  On the other hand, I told mom she did a great job providing her milk for this baby for 2 months - a lot of hard work. Mom also has a 313 and 32 year old at home, and  The twin to this baby died soon after birth. I tired to tell mom to do the best she can, to be proud of what she already has done. Mom really wants to be able to breast feed this baby.  I advised mom to add as much skin to skin and nuzzling with this baby as she can, followed by pumping. Mom knows to have lactation called as needed.    Maternal Data    Feeding Feeding Type: Breast Fed Length of feed: 30 min (mostly lataching on and off, few suckles in between)  [facility]Interventions Latch: Repeated attempts needed to sustain latch, nipple held in mouth throughout feeding, stimulation needed to elicit sucking reflex. Intervention(s): Adjust position;Assist  with latch;Breast massage;Breast compression  Audible Swallowing: None Intervention(s): Hand expression  Type of Nipple: Everted at rest and after stimulation  Comfort (Breast/Nipple): Soft / non-tender     Hold (Positioning): Assistance needed to correctly position infant at breast and maintain latch.  LATCH Score: 6  Lactation Tools Discussed/Used     Consult Status Consult Status: PRN Follow-up type: In-patient (NICU)    Alfred LevinsLee, Awanda Wilcock Anne 03/14/2017, 3:45 PM

## 2018-08-02 ENCOUNTER — Ambulatory Visit (INDEPENDENT_AMBULATORY_CARE_PROVIDER_SITE_OTHER): Payer: Self-pay | Admitting: Advanced Practice Midwife

## 2018-08-02 ENCOUNTER — Encounter: Payer: Self-pay | Admitting: Advanced Practice Midwife

## 2018-08-02 VITALS — BP 126/86 | HR 108 | Wt 156.0 lb

## 2018-08-02 DIAGNOSIS — Z23 Encounter for immunization: Secondary | ICD-10-CM

## 2018-08-02 DIAGNOSIS — Z789 Other specified health status: Secondary | ICD-10-CM

## 2018-08-02 DIAGNOSIS — O10919 Unspecified pre-existing hypertension complicating pregnancy, unspecified trimester: Secondary | ICD-10-CM

## 2018-08-02 DIAGNOSIS — O0990 Supervision of high risk pregnancy, unspecified, unspecified trimester: Secondary | ICD-10-CM

## 2018-08-02 DIAGNOSIS — O10913 Unspecified pre-existing hypertension complicating pregnancy, third trimester: Secondary | ICD-10-CM

## 2018-08-02 DIAGNOSIS — Z113 Encounter for screening for infections with a predominantly sexual mode of transmission: Secondary | ICD-10-CM

## 2018-08-02 DIAGNOSIS — O0993 Supervision of high risk pregnancy, unspecified, third trimester: Secondary | ICD-10-CM

## 2018-08-02 NOTE — Progress Notes (Signed)
Live Estanislado SpireJarai Interpreter- [facility]

## 2018-08-02 NOTE — Progress Notes (Signed)
Fm bb Subjective:    Haley Griffin is a 32 y.o [phone] at 7531w3d who presents today for her first obstetrical visit. She states she has not had any PNC and [facility] Her obstetrical and medical history is significant for previous PPROM of Di/Di twins at 25 weeks with urgent C/S delivery and death of Baby B 5 days later, GDM-A1, and CHTN controlled today without medications. [name] and denies current complaints including contractions, VB, DFM, HA, visual disturbances, and pain.  Pregnancy history fully reviewed.  Patient reports no bleeding, no contractions, no cramping and no leaking.  Review of Systems:   Review of Systems  Cardiovascular: Negative for leg swelling.  Gastrointestinal: Negative for nausea and vomiting.  Genitourinary: Negative for vaginal bleeding, vaginal discharge and vaginal pain.  Neurological: Negative for headaches.  All other systems reviewed and are negative.   Objective:     Wt 156 lb (70.8 kg)   LMP 11/20/2017   BMI 31.51 kg/m  Physical Exam  Constitutional: She is oriented to person, place, and time. She appears well-developed and well-nourished. No distress.  HENT:  Head: Normocephalic and atraumatic.  Eyes: Conjunctivae are normal.  Neck: Normal range of motion.  Cardiovascular: Normal rate.  Respiratory: Effort normal.  GI: Soft. There is no abdominal tenderness.  Gravid FH: 36cm  Genitourinary:    Genitourinary Comments: GBS and GC/CT (Vag. Specimen) collected. VE: 1/50/-3; Cephalic palpated   Musculoskeletal: Normal range of motion.  Neurological: She is alert and oriented to person, place, and time.  Skin: Skin is warm and dry.  Psychiatric: She has a normal mood and affect.    Maternal Exam:  Abdomen: Fundal height is 36.       Fetal Exam Fetal Monitor Review: Mode: hand-held doppler probe.   Baseline rate: 134.         Assessment:    Pregnancy: [id number] Patient  Active Problem List   Diagnosis Date Noted  . Supervision of high risk pregnancy, antepartum 08/02/2018  . Language barrier to communication 08/02/2018  . History of cesarean delivery 01/22/2017  . History of preterm delivery 12/16/2016  . History of gestational diabetes 11/19/2016  . History of chronic hypertension 11/19/2016       Plan:     Problem list reviewed and updated. -Initial labs drawn. -Complete US with BPP ordered  -NST completed today -One Hr GTT completed today -GBS collected; Pending -VBAC Consent signed -ASA protocol deferred due to GA -BP stable today --Baseline PreEclampsia labs collected; Pending -Follow up in 1 weeks.   Cherre RobinsJessica L Darlis Wragg MSN, CNM 08/02/2018

## 2018-08-03 ENCOUNTER — Other Ambulatory Visit (HOSPITAL_COMMUNITY): Payer: Self-pay

## 2018-08-03 LAB — PROTEIN / CREATININE RATIO, URINE
CREATININE, UR: 104.2 mg/dL
PROTEIN UR: 18.2 mg/dL
Protein/Creat Ratio: 175 mg/g creat (ref 0–200)

## 2018-08-03 LAB — GC/CHLAMYDIA PROBE AMP (~~LOC~~) NOT AT ARMC
CHLAMYDIA, DNA PROBE: NEGATIVE
Neisseria Gonorrhea: NEGATIVE

## 2018-08-04 ENCOUNTER — Encounter (HOSPITAL_COMMUNITY): Payer: Self-pay

## 2018-08-04 ENCOUNTER — Ambulatory Visit (HOSPITAL_COMMUNITY)
Admission: RE | Admit: 2018-08-04 | Discharge: 2018-08-04 | Disposition: A | Discharge status: Home / Self Care | Payer: Self-pay | Source: Ambulatory Visit

## 2018-08-04 ENCOUNTER — Telehealth: Payer: Self-pay | Admitting: *Deleted

## 2018-08-04 DIAGNOSIS — Z363 Encounter for antenatal screening for malformations: Secondary | ICD-10-CM | POA: Insufficient documentation

## 2018-08-04 DIAGNOSIS — O09293 Supervision of pregnancy with other poor reproductive or obstetric history, third trimester: Secondary | ICD-10-CM | POA: Insufficient documentation

## 2018-08-04 DIAGNOSIS — O09213 Supervision of pregnancy with history of pre-term labor, third trimester: Secondary | ICD-10-CM | POA: Insufficient documentation

## 2018-08-04 DIAGNOSIS — O10919 Unspecified pre-existing hypertension complicating pregnancy, unspecified trimester: Secondary | ICD-10-CM

## 2018-08-04 DIAGNOSIS — O10013 Pre-existing essential hypertension complicating pregnancy, third trimester: Secondary | ICD-10-CM | POA: Insufficient documentation

## 2018-08-04 DIAGNOSIS — O0933 Supervision of pregnancy with insufficient antenatal care, third trimester: Secondary | ICD-10-CM | POA: Insufficient documentation

## 2018-08-04 DIAGNOSIS — O0990 Supervision of high risk pregnancy, unspecified, unspecified trimester: Secondary | ICD-10-CM

## 2018-08-04 DIAGNOSIS — O34219 Maternal care for unspecified type scar from previous cesarean delivery: Secondary | ICD-10-CM | POA: Insufficient documentation

## 2018-08-04 DIAGNOSIS — Z3A36 36 weeks gestation of pregnancy: Secondary | ICD-10-CM | POA: Insufficient documentation

## 2018-08-04 MED ORDER — CEPHALEXIN 500 MG PO CAPS: 500.0000 mg | ORAL_CAPSULE | Freq: Three times a day (TID) | ORAL | 0 refills | Status: DC

## 2018-08-04 NOTE — Telephone Encounter (Addendum)
-----   Message from Armando ReichertHeather D Hogan, CNM sent at 08/04/2018 10:04 AM EST ----- Patient has GBS UTI. I have sent in a prescription to her pharmacy. Please call her.  12/18  1035  Attempted to call pt w/[employer] interpreter assistance however Haley Griffin interpreter was not available. When pt telephone number was dialed, the call went to voice mail - message was not left. In addition to test results and medication prescribed, pt needs to be informed that her appt @ [facility] on 12/24 has been cancelled (it was scheduled in error) and she only needs to keep appt in our office as scheduled on 12/26.  **Live Haley Griffin interpreter is scheduled to be in office on 12/19 with a different pt - will have interpreter call this pt on 12/19.  12/20 Haley Griffin interpreter did not come to office as scheduled on 12/19 and therefore could not assist with calling this pt. I [facility] however interpreter was not available. Pt has scheduled appt @ office on 12/26 and will be informed of need for antibiotic Tx due to +GBS in urine.   12/23  1520  Called pt w/interpreter Haley Griffin Pilot. Pt's husband answered and stated he is at work. He was informed that pt has UTI and requires antibiotic - Rx has been sent to her pharmacy. He should pick up medication today. He was also advised that [name]'s appt for ultrasound tomorrow has been cancelled. She should keep appt as scheduled in office on 12/26 @ 0815. He voiced understanding of all instructions and will inform [name].

## 2018-08-04 NOTE — Addendum Note (Signed)
Addended by: Thressa ShellerHOGAN, HEATHER D on: 08/04/2018 10:05 AM   Modules accepted: Orders

## 2018-08-08 LAB — CULTURE, BETA STREP (GROUP B ONLY): STREP GP B CULTURE: POSITIVE — AB

## 2018-08-09 LAB — URINE CULTURE, OB REFLEX

## 2018-08-09 LAB — CULTURE, OB URINE

## 2018-08-10 ENCOUNTER — Ambulatory Visit (HOSPITAL_COMMUNITY): Payer: Self-pay

## 2018-08-10 ENCOUNTER — Encounter (HOSPITAL_COMMUNITY): Payer: Self-pay

## 2018-08-12 ENCOUNTER — Ambulatory Visit (INDEPENDENT_AMBULATORY_CARE_PROVIDER_SITE_OTHER): Payer: Self-pay | Admitting: *Deleted

## 2018-08-12 ENCOUNTER — Encounter: Payer: Self-pay | Admitting: Obstetrics and Gynecology

## 2018-08-12 ENCOUNTER — Encounter: Payer: Self-pay | Admitting: *Deleted

## 2018-08-12 ENCOUNTER — Ambulatory Visit (INDEPENDENT_AMBULATORY_CARE_PROVIDER_SITE_OTHER): Payer: Self-pay | Admitting: Obstetrics and Gynecology

## 2018-08-12 ENCOUNTER — Ambulatory Visit: Payer: Self-pay

## 2018-08-12 VITALS — BP 124/84 | HR 93 | Wt 153.6 lb

## 2018-08-12 DIAGNOSIS — Z789 Other specified health status: Secondary | ICD-10-CM

## 2018-08-12 DIAGNOSIS — R8271 Bacteriuria: Secondary | ICD-10-CM

## 2018-08-12 DIAGNOSIS — O10913 Unspecified pre-existing hypertension complicating pregnancy, third trimester: Secondary | ICD-10-CM

## 2018-08-12 DIAGNOSIS — O99013 Anemia complicating pregnancy, third trimester: Secondary | ICD-10-CM

## 2018-08-12 DIAGNOSIS — O0933 Supervision of pregnancy with insufficient antenatal care, third trimester: Secondary | ICD-10-CM

## 2018-08-12 DIAGNOSIS — O0930 Supervision of pregnancy with insufficient antenatal care, unspecified trimester: Secondary | ICD-10-CM

## 2018-08-12 DIAGNOSIS — O0990 Supervision of high risk pregnancy, unspecified, unspecified trimester: Secondary | ICD-10-CM

## 2018-08-12 DIAGNOSIS — Z8679 Personal history of other diseases of the circulatory system: Secondary | ICD-10-CM

## 2018-08-12 DIAGNOSIS — O99019 Anemia complicating pregnancy, unspecified trimester: Secondary | ICD-10-CM | POA: Insufficient documentation

## 2018-08-12 DIAGNOSIS — O0993 Supervision of high risk pregnancy, unspecified, third trimester: Secondary | ICD-10-CM

## 2018-08-12 DIAGNOSIS — Z98891 History of uterine scar from previous surgery: Secondary | ICD-10-CM

## 2018-08-12 DIAGNOSIS — O10919 Unspecified pre-existing hypertension complicating pregnancy, unspecified trimester: Secondary | ICD-10-CM

## 2018-08-12 NOTE — Addendum Note (Signed)
Addended by: Gwendlyn DeutscherJIMERSON, COURTNEY A on: 08/12/2018 11:06 AM   Modules accepted: Kipp BroodSmartSet

## 2018-08-12 NOTE — Progress Notes (Signed)
Interpreter Elenor LegatoWier Barb present for encounter. Detail US and BPP done 12/18. Pt reports decreased appetite.

## 2018-08-12 NOTE — Progress Notes (Signed)
Prenatal Visit Note Date: 08/12/2018 [facility]  Subjective:  Haley Griffin is a 32 y.o. [id number] at 1653w6d being seen today for ongoing prenatal care.  She is currently monitored for the following issues for this high-risk pregnancy and has History of gestational diabetes; History of chronic hypertension; History of preterm delivery; History of cesarean delivery; Supervision of high risk pregnancy, antepartum; Language barrier to communication; GBS bacteriuria; and Late prenatal care on their problem list.  Patient reports no complaints.   Contractions: Irregular. Vag. Bleeding: None.  Movement: Present. Denies leaking of fluid.   The following portions of the patient's history were reviewed and updated as appropriate: allergies, current medications, past family history, past medical history, past social history, past surgical history and problem list. Problem list updated.  Objective:   Vitals:   08/12/18 0835  BP: 124/84  Pulse: 93  Weight: 153 lb 9.6 oz (69.7 kg)    Fetal Status: Fetal Heart Rate (bpm): NST   Movement: Present   vertex  General:  Alert, oriented and cooperative. Patient is in no acute distress.  Skin: Skin is warm and dry. No rash noted.   Cardiovascular: Normal heart rate noted  Respiratory: Normal respiratory effort, no problems with respiration noted  Abdomen: Soft, gravid, appropriate for gestational age. Pain/Pressure: Present     Pelvic:  Cervical exam deferred        Extremities: Normal range of motion.  Edema: None  Mental Status: Normal mood and affect. Normal behavior. Normal judgment and thought content.   Urinalysis:      Assessment and Plan:  Pregnancy: [id number] at 8853w6d  1. Supervision of high risk pregnancy, antepartum Routine care. Labs pending from visit last week.   2. HTN in pregnancy, chronic Doing well on no meds. Normal growth, anatomy last week. bpp 10/10 today, ceph. Rpt one week. Set up iol for 39wks. Pt okay  with iol  3. Language barrier to communication interepreter used  4. Late prenatal care No issues  5. History of chronic hypertension See above  6. History of cesarean delivery vbac consent signed already  7. GBS bacteriuria Confirms on tx. tx in labor  Term labor symptoms and general obstetric precautions including but not limited to vaginal bleeding, contractions, leaking of fluid and fetal movement were reviewed in detail with the patient. Please refer to After Visit Summary for other counseling recommendations.  Return in about 1 week (around 08/19/2018) for hrob, nst, bpp.   Thayer BingPickens, Vonda Harth, MD

## 2018-08-12 NOTE — Progress Notes (Signed)

## 2018-08-17 ENCOUNTER — Other Ambulatory Visit: Payer: Self-pay | Admitting: *Deleted

## 2018-08-17 ENCOUNTER — Ambulatory Visit (INDEPENDENT_AMBULATORY_CARE_PROVIDER_SITE_OTHER): Payer: Self-pay | Admitting: *Deleted

## 2018-08-17 ENCOUNTER — Encounter (HOSPITAL_COMMUNITY): Payer: Self-pay

## 2018-08-17 ENCOUNTER — Other Ambulatory Visit (HOSPITAL_COMMUNITY): Payer: Self-pay

## 2018-08-17 ENCOUNTER — Ambulatory Visit (HOSPITAL_COMMUNITY)
Admission: RE | Admit: 2018-08-17 | Discharge: 2018-08-17 | Disposition: A | Discharge status: Home / Self Care | Payer: Self-pay | Source: Ambulatory Visit

## 2018-08-17 VITALS — BP 121/80 | HR 90 | Wt 156.2 lb

## 2018-08-17 DIAGNOSIS — O34219 Maternal care for unspecified type scar from previous cesarean delivery: Secondary | ICD-10-CM

## 2018-08-17 DIAGNOSIS — O09293 Supervision of pregnancy with other poor reproductive or obstetric history, third trimester: Secondary | ICD-10-CM

## 2018-08-17 DIAGNOSIS — O0933 Supervision of pregnancy with insufficient antenatal care, third trimester: Secondary | ICD-10-CM

## 2018-08-17 DIAGNOSIS — O09213 Supervision of pregnancy with history of pre-term labor, third trimester: Secondary | ICD-10-CM

## 2018-08-17 DIAGNOSIS — O10913 Unspecified pre-existing hypertension complicating pregnancy, third trimester: Secondary | ICD-10-CM

## 2018-08-17 DIAGNOSIS — O0990 Supervision of high risk pregnancy, unspecified, unspecified trimester: Secondary | ICD-10-CM

## 2018-08-17 DIAGNOSIS — Z3A38 38 weeks gestation of pregnancy: Secondary | ICD-10-CM

## 2018-08-17 DIAGNOSIS — O10919 Unspecified pre-existing hypertension complicating pregnancy, unspecified trimester: Secondary | ICD-10-CM

## 2018-08-17 NOTE — Progress Notes (Signed)
BPP done @ [facility]

## 2018-08-18 ENCOUNTER — Inpatient Hospital Stay (HOSPITAL_COMMUNITY)
Admission: AD | Admit: 2018-08-18 | Discharge: 2018-08-20 | DRG: 807 | Disposition: A | Discharge status: Home / Self Care | Payer: Medicaid Other | Attending: Obstetrics and Gynecology | Admitting: Obstetrics and Gynecology

## 2018-08-18 ENCOUNTER — Encounter (HOSPITAL_COMMUNITY): Payer: Self-pay | Admitting: *Deleted

## 2018-08-18 ENCOUNTER — Other Ambulatory Visit: Payer: Self-pay

## 2018-08-18 DIAGNOSIS — O9902 Anemia complicating childbirth: Secondary | ICD-10-CM | POA: Diagnosis present

## 2018-08-18 DIAGNOSIS — O99824 Streptococcus B carrier state complicating childbirth: Secondary | ICD-10-CM | POA: Diagnosis not present

## 2018-08-18 DIAGNOSIS — O1002 Pre-existing essential hypertension complicating childbirth: Principal | ICD-10-CM | POA: Diagnosis present

## 2018-08-18 DIAGNOSIS — O34219 Maternal care for unspecified type scar from previous cesarean delivery: Secondary | ICD-10-CM | POA: Diagnosis present

## 2018-08-18 DIAGNOSIS — Z3A38 38 weeks gestation of pregnancy: Secondary | ICD-10-CM

## 2018-08-18 DIAGNOSIS — Z3483 Encounter for supervision of other normal pregnancy, third trimester: Secondary | ICD-10-CM | POA: Diagnosis present

## 2018-08-18 DIAGNOSIS — O0990 Supervision of high risk pregnancy, unspecified, unspecified trimester: Secondary | ICD-10-CM

## 2018-08-18 DIAGNOSIS — O10919 Unspecified pre-existing hypertension complicating pregnancy, unspecified trimester: Secondary | ICD-10-CM

## 2018-08-18 LAB — CBC
HCT: 33.2 % — ABNORMAL LOW (ref 36.0–46.0)
Hemoglobin: 10.1 g/dL — ABNORMAL LOW (ref 12.0–15.0)
MCH: 22.7 pg — ABNORMAL LOW (ref 26.0–34.0)
MCHC: 30.4 g/dL (ref 30.0–36.0)
MCV: 74.6 fL — ABNORMAL LOW (ref 80.0–100.0)
Platelets: 173 10*3/uL (ref 150–400)
RBC: 4.45 MIL/uL (ref 3.87–5.11)
RDW: 15.4 % (ref 11.5–15.5)
WBC: 12.5 10*3/uL — ABNORMAL HIGH (ref 4.0–10.5)
nRBC: 0 % (ref 0.0–0.2)

## 2018-08-18 LAB — TYPE AND SCREEN
ABO/RH(D): A POS
Antibody Screen: NEGATIVE

## 2018-08-18 MED ORDER — SODIUM CHLORIDE 0.9 % IV SOLN
5.0000 10*6.[IU] | Freq: Once | INTRAVENOUS | Status: AC
  Administered 2018-08-18: 5 10*6.[IU] via INTRAVENOUS
  Filled 2018-08-18: qty 5

## 2018-08-18 MED ORDER — OXYTOCIN 40 UNITS IN LACTATED RINGERS INFUSION - SIMPLE MED
2.5000 [IU]/h | INTRAVENOUS | Status: DC
  Filled 2018-08-18: qty 1000

## 2018-08-18 MED ORDER — ACETAMINOPHEN 325 MG PO TABS: 650.0000 mg | ORAL_TABLET | ORAL | Status: DC | PRN

## 2018-08-18 MED ORDER — OXYTOCIN BOLUS FROM INFUSION
500.0000 mL | Freq: Once | INTRAVENOUS | Status: AC
  Administered 2018-08-18: 500 mL via INTRAVENOUS

## 2018-08-18 MED ORDER — ONDANSETRON HCL 4 MG/2ML IJ SOLN: 4.0000 mg | Freq: Four times a day (QID) | INTRAMUSCULAR | Status: DC | PRN

## 2018-08-18 MED ORDER — FENTANYL CITRATE (PF) 100 MCG/2ML IJ SOLN: 50.0000 ug | INTRAMUSCULAR | Status: DC | PRN

## 2018-08-18 MED ORDER — SOD CITRATE-CITRIC ACID 500-334 MG/5ML PO SOLN: 30.0000 mL | ORAL | Status: DC | PRN

## 2018-08-18 MED ORDER — LACTATED RINGERS IV SOLN: 500.0000 mL | INTRAVENOUS | Status: DC | PRN

## 2018-08-18 MED ORDER — LIDOCAINE HCL (PF) 1 % IJ SOLN
30.0000 mL | INTRAMUSCULAR | Status: DC | PRN
  Filled 2018-08-18: qty 30

## 2018-08-18 MED ORDER — PENICILLIN G 3 MILLION UNITS IVPB - SIMPLE MED
3.0000 10*6.[IU] | INTRAVENOUS | Status: DC
  Filled 2018-08-18 (×3): qty 100

## 2018-08-18 MED ORDER — OXYCODONE-ACETAMINOPHEN 5-325 MG PO TABS: 2.0000 | ORAL_TABLET | ORAL | Status: DC | PRN

## 2018-08-18 MED ORDER — OXYCODONE-ACETAMINOPHEN 5-325 MG PO TABS: 1.0000 | ORAL_TABLET | ORAL | Status: DC | PRN

## 2018-08-18 MED ORDER — LACTATED RINGERS IV SOLN
INTRAVENOUS | Status: DC
  Administered 2018-08-18: 23:00:00 via INTRAVENOUS

## 2018-08-18 NOTE — MAU Note (Signed)
Pt presents to MAU c/o ctx since yesterday that have become more frequent. Pt reports pain every few min. No lof. Vaginal bleeding. +fm.

## 2018-08-18 NOTE — Progress Notes (Signed)
Interpreter [phone], Zoe used during delivery of infant.

## 2018-08-18 NOTE — L&D Delivery Note (Signed)
Patient is a 33 y.o. now G4P4 s/p NSVD at [redacted]w[redacted]d, who was admitted for SOL.  She progressed without augmentation to complete and pushed 6 minutes to deliver.  Cord clamping delayed by several minutes then clamped by CNM and cut by FOB.  Placenta intact and spontaneous, bleeding minimal. Mom and baby stable prior to transfer to postpartum. She plans on breastfeeding and formula feeding. She is unsure of method for birth control.  Delivery Note At 11:20 PM a viable and healthy female was delivered via VBAC, Spontaneous (Presentation: ROA ).  APGAR: 9, 9; weight pending  .   Placenta intact and spontaneous, bleeding minimal. 3V Cord with no complications.   Anesthesia:  None Episiotomy: None Lacerations: None Suture Repair: None  Est. Blood Loss (mL): 80  Mom to postpartum.  Baby to Couplet care / Skin to Skin.  Sharyon Cable CNM  08/18/2018, 11:39 PM

## 2018-08-18 NOTE — Plan of Care (Signed)
  Problem: Coping: Goal: Ability to verbalize concerns and feelings about labor and delivery will improve Outcome: Progressing   Problem: Clinical Measurements: Goal: Ability to maintain clinical measurements within normal limits will improve Outcome: Progressing Goal: Will remain free from infection Outcome: Progressing Goal: Diagnostic test results will improve Outcome: Progressing Goal: Respiratory complications will improve Outcome: Progressing Goal: Cardiovascular complication will be avoided Outcome: Progressing   Problem: Activity: Goal: Risk for activity intolerance will decrease Outcome: Progressing   Problem: Nutrition: Goal: Adequate nutrition will be maintained Outcome: Progressing   Problem: Elimination: Goal: Will not experience complications related to bowel motility Outcome: Progressing Goal: Will not experience complications related to urinary retention Outcome: Progressing   Problem: Pain Managment: Goal: General experience of comfort will improve Outcome: Progressing   Problem: Skin Integrity: Goal: Risk for impaired skin integrity will decrease Outcome: Progressing

## 2018-08-18 NOTE — H&P (Signed)
LABOR AND DELIVERY ADMISSION HISTORY AND PHYSICAL NOTE  Haley Griffin is a 33 y.o. female [phone] with IUP at [redacted]w[redacted]d by 36 wk [place] presenting for SOL.  She reports positive fetal movement. She denies leakage of fluid or vaginal bleeding.  Prenatal History/Complications: PNC at [facility] Pregnancy complications:  - Late prenatal care, care initiated at 35 weeks  - Anemia during pregnancy  - Language barrier, speaks [place]  - GBS bacteriuria  - Hx of cesarean delivery, successful VBAC- consent signed  - Hx of preterm delivery  - Hx of CHTN, no current medication  - Hx of Gestational diabetes, GTT results pending from 12/16  Past Medical History: Past Medical History:  Diagnosis Date  . Gestational diabetes   . History of gestational diabetes 11/19/2016   Borderline results on early 3hr OGTT> Saw dietician and checking CBGs  . History of preterm delivery 12/16/2016  . Hypertension     Past Surgical History: Past Surgical History:  Procedure Laterality Date  . CESAREAN SECTION MULTI-GESTATIONAL N/A 12/19/2016   Procedure: CESAREAN SECTION MULTI-GESTATIONAL;  Surgeon: Alpine Bing, MD;  Location: [facility] BIRTHING SUITES;  Service: Obstetrics;  Laterality: N/A;    Obstetrical History: OB History    Gravida  4   Para  3   Term  2   Preterm  1   AB      Living  3     SAB      TAB      Ectopic      Multiple  1   Live Births  4           Social History: Social History   Socioeconomic History  . Marital status: Married    Spouse name: Not on file  . Number of children: Not on file  . Years of education: Not on file  . Highest education level: Not on file  Occupational History  . Not on file  Social Needs  . Financial resource strain: Not on file  . Food insecurity:    Worry: Not on file    Inability: Not on file  . Transportation needs:    Medical: Not on file    Non-medical: Not on file  Tobacco Use  . Smoking status: Never Smoker  . Smokeless tobacco: Never Used   Substance and Sexual Activity  . Alcohol use: No  . Drug use: No  . Sexual activity: Yes    Birth control/protection: None  Lifestyle  . Physical activity:    Days per week: Not on file    Minutes per session: Not on file  . Stress: Not on file  Relationships  . Social connections:    Talks on phone: Not on file    Gets together: Not on file    Attends religious service: Not on file    Active member of club or organization: Not on file    Attends meetings of clubs or organizations: Not on file    Relationship status: Not on file  Other Topics Concern  . Not on file  Social History Narrative  . Not on file    Family History: Family History  Problem Relation Age of Onset  . Diabetes Mother     Allergies: No Known Allergies  Medications Prior to Admission  Medication Sig Dispense Refill Last Dose  . aspirin EC 81 MG tablet Take 1 tablet (81 mg total) by mouth daily. (Patient not taking: Reported on 08/02/2018) 30 tablet 3 Not Taking  . cephALEXin (KEFLEX)  500 MG capsule Take 1 capsule (500 mg total) by mouth 3 (three) times daily. 21 capsule 0 Taking  . docusate sodium (COLACE) 100 MG capsule Take 1 capsule (100 mg total) by mouth 2 (two) times daily. (Patient not taking: Reported on 08/02/2018) 30 capsule 0 Not Taking  . ferrous sulfate 325 (65 FE) MG tablet Take 1 tablet (325 mg total) by mouth daily with breakfast. (Patient not taking: Reported on 08/02/2018) 30 tablet 1 Not Taking  . ibuprofen (ADVIL,MOTRIN) 600 MG tablet Take 1 tablet (600 mg total) by mouth every 6 (six) hours as needed for headache, mild pain or cramping. (Patient not taking: Reported on 08/02/2018) 30 tablet 1 Not Taking  . oxyCODONE-acetaminophen (ROXICET) 5-325 MG tablet Take 1 tablet by mouth every 4 (four) hours as needed for severe pain. (Patient not taking: Reported on 01/20/2017) 30 tablet 0 Not Taking  . prenatal vitamin w/FE, FA (PRENATAL 1 + 1) 27-1 MG TABS tablet Take 1 tablet by mouth daily  at 12 noon.   Taking     Review of Systems  All systems reviewed and negative except as stated in HPI  Physical Exam Blood pressure 126/82, pulse 85, temperature 97.9 F (36.6 C), temperature source Oral, resp. rate 18, weight 71.2 kg, last menstrual period 11/20/2017, unknown if currently breastfeeding. General appearance: alert, cooperative and no distress Lungs: clear to auscultation bilaterally Heart: regular rate and rhythm Abdomen: soft, non-tender; bowel sounds normal Extremities: No calf swelling or tenderness Presentation: cephalic by cervical examination  Fetal monitoring: 130/ moderate/+accels/ no decelerations  Uterine activity: 2-5 minutes/ mild- moderate by palpation  Dilation: 5.5 Effacement (%): 80 Station: -2 Exam by:: TLYTLE RN   Prenatal labs: ABO/Rh: A Positive  Antibody: negative  Rubella: Immune  RPR: negative  HIV: negative  HBsAG: negative  GC/Chlamydia: Negative (12/16) GBS:   Positive (12/16) 1 hr Glucola: pending  Genetic screening:  Late to care  Anatomy US: normal   Nursing Staff Provider  Office Location  [facility] Dating   lmp=36wk u/s  Language  Jarai Anatomy US   neg  Flu Vaccine  08/02/18 Genetic Screen  Late PNC  TDaP vaccine   08/02/18 Hgb A1C or  GTT Early  Third trimester   Rhogam   n/a   LAB RESULTS   Feeding Plan Both Blood Type   A pos  Contraception Undecided Antibody  neg  Circumcision Discuss w/ husband Rubella  imm  Pediatrician  [facility] RPR   neg  Support Person Chung(FOB) HBsAg   neg  Prenatal Classes  HIV  neg  BTL Consent  GBS   POSITIVE   VBAC Consent  done Pap      Hgb Electro   neg    CF     SMA     Waterbirth  [ ]  Class [ ]  Consent [ ]  CNM visit   Prenatal Transfer Tool  Maternal Diabetes: Hx of gestational diabetes, GTT pending  Genetic Screening: Late to prenatal care  Maternal Ultrasounds/Referrals: Normal Fetal Ultrasounds or other Referrals:  None Maternal Substance Abuse:   No Significant Maternal Medications:  None Significant Maternal Lab Results: Lab values include: Group B Strep positive  No results found for this or any previous visit (from the past 24 hour(s)).  Patient Active Problem List   Diagnosis Date Noted  . GBS bacteriuria 08/12/2018  . Late prenatal care 08/12/2018  . Anemia in pregnancy 08/12/2018  . Supervision of high risk pregnancy, antepartum 08/02/2018  .  Language barrier to communication 08/02/2018  . History of cesarean delivery 01/22/2017  . History of preterm delivery 12/16/2016  . History of gestational diabetes 11/19/2016  . History of chronic hypertension 11/19/2016    Assessment: Haley Griffin is a 33 y.o. [id number] at 5452w5d here for SOL, limited/late prenatal care   #Labor: Expectant management  #Pain: Pain medication ordered PRN  #FWB: Cat I  #ID:  GBS pos, PCN for treatment  #MOF: Breast and formula  #MOC:Unsure  #Circ:  Alger MemosUnsure   Zahava Quant C, CNM 08/18/2018, 9:54 PM

## 2018-08-19 ENCOUNTER — Encounter (HOSPITAL_COMMUNITY): Payer: Self-pay

## 2018-08-19 DIAGNOSIS — O34219 Maternal care for unspecified type scar from previous cesarean delivery: Secondary | ICD-10-CM

## 2018-08-19 DIAGNOSIS — O10919 Unspecified pre-existing hypertension complicating pregnancy, unspecified trimester: Secondary | ICD-10-CM

## 2018-08-19 LAB — RPR: RPR Ser Ql: NONREACTIVE

## 2018-08-19 MED ORDER — TETANUS-DIPHTH-ACELL PERTUSSIS 5-2.5-18.5 LF-MCG/0.5 IM SUSP: 0.5000 mL | Freq: Once | INTRAMUSCULAR | Status: DC

## 2018-08-19 MED ORDER — COCONUT OIL OIL
1.0000 "application " | TOPICAL_OIL | Status: DC | PRN
  Administered 2018-08-20: 1 via TOPICAL
  Filled 2018-08-19: qty 120

## 2018-08-19 MED ORDER — DIPHENHYDRAMINE HCL 25 MG PO CAPS: 25.0000 mg | ORAL_CAPSULE | Freq: Four times a day (QID) | ORAL | Status: DC | PRN

## 2018-08-19 MED ORDER — ACETAMINOPHEN 325 MG PO TABS
650.0000 mg | ORAL_TABLET | ORAL | Status: DC | PRN
  Administered 2018-08-19: 650 mg via ORAL
  Filled 2018-08-19: qty 2

## 2018-08-19 MED ORDER — SENNOSIDES-DOCUSATE SODIUM 8.6-50 MG PO TABS
2.0000 | ORAL_TABLET | ORAL | Status: DC
  Administered 2018-08-19 – 2018-08-20 (×2): 2 via ORAL
  Filled 2018-08-19 (×2): qty 2

## 2018-08-19 MED ORDER — BENZOCAINE-MENTHOL 20-0.5 % EX AERO: 1.0000 "application " | INHALATION_SPRAY | CUTANEOUS | Status: DC | PRN

## 2018-08-19 MED ORDER — ONDANSETRON HCL 4 MG/2ML IJ SOLN: 4.0000 mg | INTRAMUSCULAR | Status: DC | PRN

## 2018-08-19 MED ORDER — ZOLPIDEM TARTRATE 5 MG PO TABS: 5.0000 mg | ORAL_TABLET | Freq: Every evening | ORAL | Status: DC | PRN

## 2018-08-19 MED ORDER — ONDANSETRON HCL 4 MG PO TABS: 4.0000 mg | ORAL_TABLET | ORAL | Status: DC | PRN

## 2018-08-19 MED ORDER — SIMETHICONE 80 MG PO CHEW: 80.0000 mg | CHEWABLE_TABLET | ORAL | Status: DC | PRN

## 2018-08-19 MED ORDER — DIBUCAINE 1 % RE OINT: 1.0000 "application " | TOPICAL_OINTMENT | RECTAL | Status: DC | PRN

## 2018-08-19 MED ORDER — WITCH HAZEL-GLYCERIN EX PADS: 1.0000 "application " | MEDICATED_PAD | CUTANEOUS | Status: DC | PRN

## 2018-08-19 MED ORDER — IBUPROFEN 600 MG PO TABS
600.0000 mg | ORAL_TABLET | Freq: Four times a day (QID) | ORAL | Status: DC
  Administered 2018-08-19 – 2018-08-20 (×7): 600 mg via ORAL
  Filled 2018-08-19 (×7): qty 1

## 2018-08-19 MED ORDER — PRENATAL MULTIVITAMIN CH
1.0000 | ORAL_TABLET | Freq: Every day | ORAL | Status: DC
  Administered 2018-08-19 – 2018-08-20 (×2): 1 via ORAL
  Filled 2018-08-19 (×2): qty 1

## 2018-08-19 NOTE — Plan of Care (Signed)
Patient is progressing, voided, ambulating and breast feeding well.  Issue of no interpreter available on night for Jarai.  Will have to hold paper work for interpreter

## 2018-08-19 NOTE — Progress Notes (Signed)
Attempted to get interpeter

## 2018-08-19 NOTE — Discharge Summary (Signed)
Postpartum Discharge Summary     Patient Name: Haley Griffin DOB: May 01, 1986 MRN: [id number]  Date of admission: 08/18/2018 Delivering Provider: Sharyon Cable   Date of discharge: 08/20/2018  Admitting diagnosis: 38 WKS, PAIN Intrauterine pregnancy: [redacted]w[redacted]d     Secondary diagnosis:  Active Problems:   Precipitous delivery   VBAC, delivered   Chronic hypertension in pregnancy  Additional problems: SVD, Limited prenatal care, Anemia      Discharge diagnosis: Term Pregnancy Delivered, CHTN and Anemia                                                                                                Post partum procedures:none  Augmentation: none  Complications: None  Hospital course:  Onset of Labor With Vaginal Delivery     33 y.o. yo [phone] at [redacted]w[redacted]d was admitted in Active Labor on 08/18/2018. Patient had an uncomplicated labor course as follows:  Membrane Rupture Time/Date: 11:18 PM ,08/18/2018   Intrapartum Procedures: Episiotomy: None [1]                                         Lacerations:  None [1]  Patient had a delivery of a Viable infant. 08/18/2018  Information for the patient's newborn:  Trinette, Ledee [[id number]]  Delivery Method: VBAC, Spontaneous(Filed from Delivery Summary)    Pateint had an uncomplicated postpartum course.  She is ambulating, tolerating a regular diet, passing flatus, and urinating well. Patient is discharged home in stable condition on 08/20/18.   Magnesium Sulfate recieved: No BMZ received: No  Physical exam  Vitals:   08/19/18 0930 08/19/18 1417 08/19/18 2212 08/20/18 0626  BP: 111/70 115/77 111/78 102/71  Pulse: 86 96 84 71  Resp: 16 16 18 18   Temp: 98.6 F (37 C) 98.2 F (36.8 C) 98.5 F (36.9 C) 98.1 F (36.7 C)  TempSrc: Oral Oral Oral Oral  SpO2: 98%  99%   Weight:      Height:       General: alert, cooperative and no distress Lochia: appropriate Uterine Fundus: firm Incision: N/A DVT Evaluation: No evidence of DVT seen on physical  exam. Labs: Lab Results  Component Value Date   WBC 12.5 (H) 08/18/2018   HGB 10.1 (L) 08/18/2018   HCT 33.2 (L) 08/18/2018   MCV 74.6 (L) 08/18/2018   PLT 173 08/18/2018   CMP Latest Ref Rng & Units 11/19/2016  Glucose 65 - 99 mg/dL 02(I)  BUN 6 - 20 mg/dL 7  Creatinine 0.97 - 3.53 mg/dL 2.99(M)  Sodium 426 - 834 mmol/L 135  Potassium 3.5 - 5.2 mmol/L 4.0  Chloride 96 - 106 mmol/L 99  CO2 18 - 29 mmol/L 20  Calcium 8.7 - 10.2 mg/dL 9.3  Total Protein 6.0 - 8.5 g/dL 6.6  Total Bilirubin 0.0 - 1.2 mg/dL 0.4  Alkaline Phos 39 - 117 IU/L 67  AST 0 - 40 IU/L 15  ALT 0 - 32 IU/L 12    Discharge instruction: per After Visit  Summary and "Baby and Me Booklet".  After visit meds:  Allergies as of 08/20/2018   No Known Allergies     Medication List    TAKE these medications   ibuprofen 600 MG tablet Commonly known as:  ADVIL,MOTRIN Take 1 tablet (600 mg total) by mouth every 6 (six) hours.   prenatal vitamin w/FE, FA 27-1 MG Tabs tablet Take 1 tablet by mouth daily at 12 noon.   senna-docusate 8.6-50 MG tablet Commonly known as:  Senokot-S Take 2 tablets by mouth daily. Start taking on:  August 21, 2018       Diet: routine diet  Activity: Advance as tolerated. Pelvic rest for 6 weeks.   Outpatient follow up:4 weeks Follow up Appt: Future Appointments  Date Time Provider Department Center  08/25/2018 10:00 AM [facility] NURSE [facility]OC  09/29/2018  8:15 AM Marny Lowenstein, PA-C [facility]OC  09/29/2018  8:50 AM [facility] LAB [facility] WOC   Follow up Visit: Follow-up Information    [facility]. Go on 08/25/2018.   Specialty:  Obstetrics and Gynecology Why:  For BP Check  Go to postpartum visit on 2/12 at 8:15 Contact information: [address] [place] [address] [phone]           Please schedule this patient for Postpartum visit in: 6 weeks with the following provider: Any provider For C/S patients schedule  nurse incision check in weeks 2 weeks: no High risk pregnancy complicated by: HTN Delivery mode:  SVD Anticipated Birth Control:  other/unsure PP Procedures needed: BP check and 2hr GTT  Schedule Integrated BH visit: no   Newborn Data: Live born female  Birth Weight:   APGAR: 9, 9  Newborn Delivery   Birth date/time:  08/18/2018 23:20:00 Delivery type:  VBAC, Spontaneous     Baby Feeding: Breast Disposition:home with mother   08/20/2018 De Hollingshead, DO

## 2018-08-19 NOTE — Progress Notes (Signed)
POSTPARTUM PROGRESS NOTE  Post Partum Day 1  Subjective:  Haley Griffin is a 33 y.o. D3T7017 s/p VBAC at [redacted]w[redacted]d- precip delivery.  No acute events overnight.  Pt denies problems with ambulating, voiding or po intake.  She denies nausea or vomiting.  Pain is well controlled.  She has not had flatus. She has not had bowel movement.  Lochia Moderate.   Objective: Blood pressure 105/70, pulse 82, temperature 98.1 F (36.7 C), temperature source Oral, resp. rate 18, height 4\' 11"  (1.499 m), weight 71.2 kg, last menstrual period 11/20/2017, unknown if currently breastfeeding.  Physical Exam:  General: alert, cooperative and no distress Abdomen: soft, nontender,  Uterine Fundus: firm, appropriately tender @U  DVT Evaluation: No calf swelling or tenderness  Extremities: No edema  Recent Labs    08/18/18 2217  HGB 10.1*  HCT 33.2*    Assessment/Plan: Haley Griffin is a 33 y.o. B9T9030 s/p VBAC at [redacted]w[redacted]d   PPD#1 - Doing well Contraception: unsure Feeding: Breast and formula  Dispo: Plan for discharge tomorrow, rooming in with baby- Baby unable to be discharged home until Saturday morning due to inadequate treatment for GBS.   LOS: 1 day   Sharyon Cable, CNM 08/19/2018, 6:40 AM

## 2018-08-19 NOTE — Progress Notes (Signed)
Attempted to call interpreter  

## 2018-08-19 NOTE — Progress Notes (Signed)
Phone/stratus interpreter not available. Patient's husband states that his sister will be here shortly who speaks [place] and can assist. Called interpreter services at [facility]. Interpreter services to find available interpreter and call back once time available/scheduled. Earl Gala, Linda Hedges Henning

## 2018-08-19 NOTE — Lactation Note (Addendum)
This note was copied from a baby's chart. Lactation Consultation Note  Patient Name: Haley Griffin UKGUR'K Date: 08/19/2018 Reason for consult: Initial assessment;Early term 37-38.6wks;Maternal endocrine disorder Type of Endocrine Disorder?: Diabetes  No Haley Griffin interpreter available in the language line at this time, LC used patient's sister "Haley Griffin" to help with interpreter services with patient's consent. Interpreter waiver form was left in the room to be filled out; since there may be another one of mom's sister coming in to assist with interpreter services due to lack of interpreters in [employer].  16 hours old early term female who is being exclusively BF by her mother, she's a P4 and experienced BF. She was able to BF her other kids for 18 months, including the surviving twin from a previous pregnancy (twin B passed away). Mom participated in the [facility] program and she's already familiar with hand expression, she has lots of colostrum, several drops came out when Haley Griffin revised hand expression with mom; LC rubbed them on mom's nipples. She voiced some soreness in her nipples, they look slightly red but no further signs of trauma. Reviewed tips for a deep latch, prevention and treatment for sore nipples, LC also requested some coconut oil to Haley Griffin her RN for breast care. Mom doesn't have a breast pump at home.  Mom has Hx of GDM and chronic HTN but they don't seem to affect her milk supply at this point. When LC came back to the room, baby was latched on and nursing in typical cradle hold but noticed that the latch was shallow; mom had baby wearing clothes and swaddled with 2 blankets. Explained to mom the importance of STS. Worked with mom on getting a deep latch with cross cradle, she followed suggestions but latch could be a little deeper. LC did some compressions and heard a couple of swallows. Baby still nursing when exiting the room  Feeding plan:  1. Encouraged mom to feed baby  STS 8-12 times/24 hours or sooner if feeding cues are present 2. Hand expression and finger feeding was also encouraged 3. Mom will use her own colostrum and coconut oil for breast care, especially if she starts pumping. Mom is just putting baby to the breast for now  BF brochure, BF resources and feeding diary were reviewed. Mom reported all questions and concerns were answered, she's aware of LC services and will call PRN.  Maternal Data Formula Feeding for Exclusion: Yes Reason for exclusion: Mother's choice to formula and breast feed on admission Has patient been taught Hand Expression?: Yes Does the patient have breastfeeding experience prior to this delivery?: Yes  Feeding Feeding Type: Breast Fed  LATCH Score Latch: Grasps breast easily, tongue down, lips flanged, rhythmical sucking.  Audible Swallowing: A few with stimulation  Type of Nipple: Everted at rest and after stimulation  Comfort (Breast/Nipple): Soft / non-tender  Hold (Positioning): Assistance needed to correctly position infant at breast and maintain latch.  LATCH Score: 8  Interventions Interventions: Breast feeding basics reviewed;Hand express;Breast compression;Breast massage;Coconut oil  Lactation Tools Discussed/Used Tools: Coconut oil WIC Program: Yes   Consult Status Consult Status: Follow-up Date: 08/20/18 Follow-up type: In-patient    Haley Griffin 08/19/2018, 4:00 PM

## 2018-08-19 NOTE — Progress Notes (Signed)
Attempted to call interpreter

## 2018-08-19 NOTE — Clinical Social Work Maternal (Signed)
CLINICAL SOCIAL WORK MATERNAL/CHILD NOTE  Patient Details  Name: Haley Griffin MRN: [id number] Date of Birth: 12/15/1985  Date:  08/19/2018  Clinical Social Worker Initiating Note:  Geroldine Esquivias Boyd-Gilyard Date/Time: Initiated:  08/19/18/1227     Child's Name:  Haley Griffin   Biological Parents:  Mother, Father   Need for Interpreter:  Other (Comment Required)(Jarai)   Reason for Referral:  Late or No Prenatal Care    Address:  [address] [place] [address]    Phone number:  [phone] (home)     Additional phone number:   Household Members/Support Persons (HM/SP):   Household Member/Support Person 1   HM/SP Name Relationship DOB or Age  HM/SP -1 FOB (name and DOB unknown) FOB unknown  HM/SP -2 Julia Sui daughter 12/19/2016  HM/SP -3 Danny Sui son 08/06/2009  HM/SP -4        HM/SP -5        HM/SP -6        HM/SP -7        HM/SP -8          Natural Supports (not living in the home):  Parent, Immediate Family, Extended Family   Professional Supports: None   Employment: Unemployed   Type of Work:     Education:  9 to 11 years   Homebound arranged: No  Financial Resources:  Medicaid   Other Resources:  WIC, Food Stamps    Cultural/Religious Considerations Which May Impact Care:    Strengths:  Ability to meet basic needs , Home prepared for child , Pediatrician chosen   Psychotropic Medications:         Pediatrician:    [place] area  Pediatrician List:   [facility]  [place]    [place]    [place]     County    [place]      Pediatrician Fax Number:    Risk Factors/Current Problems:  Transportation    Cognitive State:  Able to Concentrate , Alert , Linear Thinking , Insightful    Mood/Affect:  Bright , Happy , Interested    CSW Assessment: CSW met with MOB to complete an assessment for late PNC.  When CSW arrived, MOB was resting in bed engaging in skin to skin with infant.   FOB was also present and he was observing MOB's and infant's interactions. MOB gave CSW permission to complete the assessments while FOB was present. CSW utilized a live interpreter to assist with language barrier. CSW explained CSW's role and encouraged MOB to ask questions. CSW inquired about MOB's PNC.  MOB and FOB communicated transportation barriers as the reasons why MOB was late receiving care.  FOB also communicated that the issue has been resolved since MOB's sister has recently moved to  and has agreed to assist with transportation.  CSW explained the hospital's policy regarding not having  adequatePNC; MOB and FOB were understanding. MOB denied the use of all illicit substance and expressed the MOB was not concerned about infants screens. CSW made MOB aware that infant's UDS was negative and CSW will continue to monitor infant's CDS.  CSW informed MOB that if infant's CDS is positive without an explanation, CSW will make a report to [place] CPS. MOB communicated MOB is prepared for the infant and has all needed items. CSW educated MOB about PPD and informed MOB of possible supports and interventions to decrease PPD.  CSW also encouraged MOB to seek medical attention   if needed for increased signs and symptoms for PPD. MOB denied PPD with MOB's oldest child. CSW reviewed safe sleep and SIDS. MOB was knowledgeable.  MOB did not have any questions or concerns at this time, and CSW thanked MOB for allowing CSW to meet with MOB. CSW Plan/Description:  No Further Intervention Required/No Barriers to Discharge, Sudden Infant Death Syndrome (SIDS) Education, Other Patient/Family Education, Other Information/Referral to Community Resources, Hospital Drug Screen Policy Information, CSW Will Continue to Monitor Umbilical Cord Tissue Drug Screen Results and Make Report if Warranted   Lakea Mittelman Boyd-Gilyard, MSW, LCSW Clinical Social Work [phone]   Lasonya Hubner D BOYD-GILYARD, LCSW 08/19/2018, 3:37  PM 

## 2018-08-20 LAB — GLUCOSE TOLERANCE, 1 HOUR: Glucose, 1Hr PP: 116 mg/dL (ref 65–199)

## 2018-08-20 LAB — INHERITEST(R) CF/SMA PANEL

## 2018-08-20 LAB — OBSTETRIC PANEL, INCLUDING HIV
ANTIBODY SCREEN: NEGATIVE
BASOS: 0 %
Basophils Absolute: 0 10*3/uL (ref 0.0–0.2)
EOS (ABSOLUTE): 0.1 10*3/uL (ref 0.0–0.4)
EOS: 1 %
HEMATOCRIT: 31.7 % — AB (ref 34.0–46.6)
HEP B S AG: NEGATIVE
HIV SCREEN 4TH GENERATION: NONREACTIVE
Hemoglobin: 9.8 g/dL — ABNORMAL LOW (ref 11.1–15.9)
Immature Grans (Abs): 0.1 10*3/uL (ref 0.0–0.1)
Immature Granulocytes: 1 %
LYMPHS ABS: 1.2 10*3/uL (ref 0.7–3.1)
Lymphs: 12 %
MCH: 22.4 pg — ABNORMAL LOW (ref 26.6–33.0)
MCHC: 30.9 g/dL — ABNORMAL LOW (ref 31.5–35.7)
MCV: 72 fL — AB (ref 79–97)
MONOCYTES: 5 %
Monocytes Absolute: 0.5 10*3/uL (ref 0.1–0.9)
NEUTROS ABS: 8 10*3/uL — AB (ref 1.4–7.0)
Neutrophils: 81 %
PLATELETS: 213 10*3/uL (ref 150–450)
RBC: 4.38 x10E6/uL (ref 3.77–5.28)
RDW: 13.9 % (ref 12.3–15.4)
RH TYPE: POSITIVE
RPR: NONREACTIVE
RUBELLA: 1.97 {index} (ref >0.99)
WBC: 9.9 10*3/uL (ref 3.4–10.8)

## 2018-08-20 LAB — HEMOGLOBINOPATHY EVALUATION
FERRITIN: 34 ng/mL (ref 15–150)
HGB A2 QUANT: 1.7 % — AB (ref 1.8–3.2)
HGB F QUANT: 0 % (ref 0.0–2.0)
Hgb A: 97.9 % (ref 96.4–98.8)
Hgb C: 0 %
Hgb S: 0 %
Hgb Solubility: NEGATIVE
Hgb Variant: 0.4 % — ABNORMAL HIGH

## 2018-08-20 LAB — ALPHA-THALASSEMIA

## 2018-08-20 MED ORDER — IBUPROFEN 600 MG PO TABS: 600.0000 mg | ORAL_TABLET | Freq: Four times a day (QID) | ORAL | 1 refills | Status: DC

## 2018-08-20 MED ORDER — SENNOSIDES-DOCUSATE SODIUM 8.6-50 MG PO TABS: 2.0000 | ORAL_TABLET | ORAL | 0 refills | Status: DC

## 2018-08-20 NOTE — Progress Notes (Signed)
Haley Griffin Interpreter,H'Tuyet Rahlan (snow)  present for translation for Pediatrician, Dr. Sherryll Burger regarding infant staying and observing for s/s of infection, feeding progress and assist with feedings as needed. Dr. Valaria Good, OB/GYN present and discussed patients current status and review discharge teaching. Nursing present for additional teaching and to answer questions.

## 2018-08-20 NOTE — Lactation Note (Signed)
This note was copied from a baby's chart. Lactation Consultation Note  Patient Name: Haley Griffin ZOXWR'UToday's Date: 08/20/2018 Reason for consult: Follow-up assessment;Early term 37-38.6wks;Nipple pain/trauma Type of Endocrine Disorder?: Diabetes  P4 mother whose infant is now 5334 hours old.  Mother breast fed her other children for 18 months.  [place] interpreter, Haley CitizenCaroline [phone]) used for interpretation.  Mother was just awakening when I arrived.  Mother stated that her nipples are sore every time she feeds baby.  When asked if she told anyone about her sore nipples she said, "Not yet." With permission I assessed her breasts and nipples.  Her breasts are filling and nipples were reddened with blisters bilaterally.  The left nipple blister was very prominent.  Mother stated that her nipples were too sore to feed right now. I offered to initiate a NS to see if this would be comfortable enough for her to use rather than completely refraining from breast feeding.  Mother was willing to try.  Positioned baby in the cross cradle hold on the right breast.  She was able to latch effectively but was not interested in sucking.  Mother had recently fed her.  I also provided a manual pump and comfort gels with instructions for use.  Mother will plan to rub EBM into nipples/areolas after feeding/pumping and alternate coconut oil with comfort gels.  Offered to help assist baby with the next latch when she is ready and showing feeding cues.    Per RN, mother will not be discharged today due to inadequate coverage with GBS status.  This will allow mother time to work on breast feeding and to receive help as needed.    Mother did a return demonstration with the manual pump and could easily remove EBM.  I suggested feeding back any EBM she obtains with pumping and hand expression.  Father requested an artificial nipple to bottle feed the EBM.  (Other supplementing choices were made available).  Baby took the EBM  and mother was planning to pump more when I left the room.    She does not have a DEBP for home use but is planning on obtaining one.  She wishes to use the manual pump until she obtains a DEBP.  Allowed time for further questions and father helpful and supportive.  Mother will call as needed for latch assistance. RN updated.    Maternal Data Formula Feeding for Exclusion: No Has patient been taught Hand Expression?: Yes Does the patient have breastfeeding experience prior to this delivery?: Yes  Feeding Feeding Type: Breast Fed  LATCH Score Latch: Too sleepy or reluctant, no latch achieved, no sucking elicited.  Audible Swallowing: None  Type of Nipple: Everted at rest and after stimulation  Comfort (Breast/Nipple): Filling, red/small blisters or bruises, mild/mod discomfort  Hold (Positioning): Assistance needed to correctly position infant at breast and maintain latch.  LATCH Score: 4  Interventions Interventions: Breast feeding basics reviewed;Assisted with latch;Skin to skin;Breast massage;Hand express;Breast compression;Hand pump;Comfort gels;Coconut oil;Expressed milk;Position options;Support pillows;Adjust position  Lactation Tools Discussed/Used Tools: Pump;Flanges;Coconut oil;Comfort gels;Nipple Shields Nipple shield size: 24 Flange Size: 27 Breast pump type: Manual WIC Program: Yes Pump Review: Setup, frequency, and cleaning;Milk Storage Initiated by:: Haley Griffin Date initiated:: 08/21/18   Consult Status Consult Status: Follow-up Date: 08/21/18 Follow-up type: In-patient    Haley SimsBeth R Dmarco Griffin 08/20/2018, 9:39 AM

## 2018-08-20 NOTE — Discharge Instructions (Signed)

## 2018-08-21 ENCOUNTER — Ambulatory Visit: Payer: Self-pay

## 2018-08-21 NOTE — Lactation Note (Signed)
This note was copied from a baby's chart. Lactation Consultation Note  Patient Name: Girl Maryjean MornYip Alvarenga ZOXWR'UToday's Date: 08/21/2018 Reason for consult: Follow-up assessment   Baby 59 hours old.  Video interpreter used for [place].  FOB stated he could understand. Observed breastfeeding and hand expression with good swallows. Mother tends to pull tissue away from infant's nose creating a shallower latch. Provided education about soreness and how infant's breathe. Feed on demand approximately 8-12 times per day.   Reviewed engorgement care and monitoring voids/stools. Stools transitioned to yellow.  Mother is wearing gels for soreness.    Maternal Data Has patient been taught Hand Expression?: Yes  Feeding Feeding Type: Breast Fed  LATCH Score Latch: Grasps breast easily, tongue down, lips flanged, rhythmical sucking.  Audible Swallowing: Spontaneous and intermittent  Type of Nipple: Everted at rest and after stimulation  Comfort (Breast/Nipple): Filling, red/small blisters or bruises, mild/mod discomfort  Hold (Positioning): Assistance needed to correctly position infant at breast and maintain latch.  LATCH Score: 8  Interventions Interventions: Breast feeding basics reviewed;Assisted with latch;Support pillows;Hand pump;Comfort gels;Coconut oil;Hand express  Lactation Tools Discussed/Used     Consult Status      Hardie PulleyBerkelhammer, Ruth Boschen 08/21/2018, 11:15 AM

## 2018-08-23 ENCOUNTER — Telehealth: Payer: Self-pay | Admitting: Family Medicine

## 2018-08-23 ENCOUNTER — Other Ambulatory Visit: Payer: Self-pay

## 2018-08-23 ENCOUNTER — Encounter: Payer: Self-pay | Admitting: Obstetrics & Gynecology

## 2018-08-23 NOTE — Telephone Encounter (Signed)
Called to inform patient the hospital is currently under flu restrictions.

## 2018-08-24 ENCOUNTER — Other Ambulatory Visit (HOSPITAL_COMMUNITY): Payer: Self-pay

## 2018-08-25 ENCOUNTER — Ambulatory Visit (INDEPENDENT_AMBULATORY_CARE_PROVIDER_SITE_OTHER): Payer: Self-pay | Admitting: General Practice

## 2018-08-25 ENCOUNTER — Inpatient Hospital Stay (HOSPITAL_COMMUNITY): Payer: Self-pay

## 2018-08-25 VITALS — BP 137/76 | HR 73 | Ht 59.0 in | Wt 144.0 lb

## 2018-08-25 DIAGNOSIS — Z0130 Encounter for examination of blood pressure without abnormal findings: Secondary | ICD-10-CM

## 2018-08-25 LAB — COMPREHENSIVE METABOLIC PANEL
ALT: 8 IU/L (ref 0–32)
AST: 14 IU/L (ref 0–40)
Albumin/Globulin Ratio: 1.3 (ref 1.2–2.2)
Albumin: 3.7 g/dL (ref 3.5–5.5)
Alkaline Phosphatase: 136 IU/L — ABNORMAL HIGH (ref 39–117)
BUN/Creatinine Ratio: 11 (ref 9–23)
BUN: 6 mg/dL (ref 6–20)
Bilirubin Total: 0.3 mg/dL (ref 0.0–1.2)
CO2: 17 mmol/L — ABNORMAL LOW (ref 20–29)
Calcium: 8.8 mg/dL (ref 8.7–10.2)
Chloride: 103 mmol/L (ref 96–106)
Creatinine, Ser: 0.56 mg/dL — ABNORMAL LOW (ref 0.57–1.00)
GFR calc Af Amer: 142 mL/min/{1.73_m2} (ref >59)
GFR, EST NON AFRICAN AMERICAN: 123 mL/min/{1.73_m2} (ref >59)
GLUCOSE: 107 mg/dL — AB (ref 65–99)
Globulin, Total: 2.8 g/dL (ref 1.5–4.5)
Potassium: 3.9 mmol/L (ref 3.5–5.2)
Sodium: 136 mmol/L (ref 134–144)
Total Protein: 6.5 g/dL (ref 6.0–8.5)

## 2018-08-25 NOTE — Progress Notes (Signed)
Agree with A & P. 

## 2018-08-25 NOTE — Progress Notes (Signed)
Patient presents to office today for BP check following vaginal delivery on 1/1. Patient has chronic HTN and was not on medication during pregnancy or thus far in postpartum period. Patient reports headaches with some dizziness since delivery- reports motrin does help. +1 edema noted in lower extremities.   BP reviewed with Dr Alysia Penna who states patient is okay to follow up at pp visit.   Reviewed with patient and discussed returning to office for worsening headaches, increased dizziness or blurry vision. Patient verbalized understanding & asked about urine results from hospital stay regarding previous infection. Told patient her urine was not checked in the hospital as we do not follow up on it unless she continues to have symptoms. Patient verbalized understanding & had no questions.  Joaquin Bend used for interpreter today.  Chase Caller RN BSN 08/25/18

## 2018-08-31 ENCOUNTER — Other Ambulatory Visit (HOSPITAL_COMMUNITY): Payer: Self-pay

## 2018-09-29 ENCOUNTER — Other Ambulatory Visit: Payer: Self-pay

## 2018-09-29 ENCOUNTER — Telehealth: Payer: Self-pay | Admitting: Medical

## 2018-09-29 ENCOUNTER — Ambulatory Visit: Payer: Self-pay | Admitting: Medical

## 2018-09-29 NOTE — Telephone Encounter (Signed)
Called on both number available in Epic.I left a message on the mobile line, however established contact on the home line. The patient to rescheduled missed appointments. Informed of new appointment dates and time. Also sending the patient a reminder letter.

## 2018-09-29 NOTE — Telephone Encounter (Signed)
Called the patient on both

## 2018-10-13 ENCOUNTER — Other Ambulatory Visit: Payer: Medicaid Other

## 2018-10-13 ENCOUNTER — Ambulatory Visit (INDEPENDENT_AMBULATORY_CARE_PROVIDER_SITE_OTHER): Payer: Medicaid Other | Admitting: Medical

## 2018-10-13 ENCOUNTER — Encounter: Payer: Self-pay | Admitting: Medical

## 2018-10-13 VITALS — BP 123/86 | HR 93 | Wt 141.8 lb

## 2018-10-13 DIAGNOSIS — O24429 Gestational diabetes mellitus in childbirth, unspecified control: Secondary | ICD-10-CM

## 2018-10-13 DIAGNOSIS — I10 Essential (primary) hypertension: Secondary | ICD-10-CM | POA: Diagnosis not present

## 2018-10-13 DIAGNOSIS — Z30017 Encounter for initial prescription of implantable subdermal contraceptive: Secondary | ICD-10-CM | POA: Diagnosis not present

## 2018-10-13 DIAGNOSIS — Z1389 Encounter for screening for other disorder: Secondary | ICD-10-CM

## 2018-10-13 DIAGNOSIS — O34219 Maternal care for unspecified type scar from previous cesarean delivery: Secondary | ICD-10-CM

## 2018-10-13 DIAGNOSIS — Z3040 Encounter for surveillance of contraceptives, unspecified: Secondary | ICD-10-CM

## 2018-10-13 DIAGNOSIS — Z8632 Personal history of gestational diabetes: Secondary | ICD-10-CM

## 2018-10-13 LAB — POCT PREGNANCY, URINE: Preg Test, Ur: NEGATIVE

## 2018-10-13 MED ORDER — ETONOGESTREL 68 MG ~~LOC~~ IMPL
68.0000 mg | DRUG_IMPLANT | Freq: Once | SUBCUTANEOUS | Status: AC
  Administered 2018-10-13: 68 mg via SUBCUTANEOUS

## 2018-10-13 NOTE — Progress Notes (Signed)
Pt wants Nexplanon 

## 2018-10-13 NOTE — Progress Notes (Signed)
Subjective:     Haley Griffin is a 33 y.o. female who presents for a postpartum visit. She is 8 weeks postpartum following a spontaneous vaginal delivery. I have fully reviewed the prenatal and intrapartum course. The delivery was at 38/5 gestational weeks. Outcome: vaginal birth after cesarean (VBAC). Anesthesia: none. Postpartum course has been normal. Baby's course has been normal. Baby is feeding by both breast and bottle - Pt does not know the name. Bleeding red and new onset, PP bleeding had stopped. Bowel function is normal. Bladder function is normal. Patient is sexually active. Contraception method is condoms. Postpartum depression screening: negative.  The following portions of the patient's history were reviewed and updated as appropriate: allergies, current medications, past family history, past medical history, past social history, past surgical history and problem list.  Review of Systems Pertinent items are noted in HPI.   Objective:    BP 123/86   Pulse 93   Wt 141 lb 12.8 oz (64.3 kg)   BMI 28.64 kg/m   General:  alert and cooperative   Breasts:  deferred  Lungs: clear to auscultation bilaterally  Heart:  regular rate and rhythm, S1, S2 normal, no murmur, click, rub or gallop  Abdomen: soft, non-tender; bowel sounds normal; no masses,  no organomegaly   Vulva:  not evaluated  Vagina: not evaluated  Cervix:  not evaluated  Corpus: not examined  Adnexa:  not evaluated  Rectal Exam: Not performed.         GYNECOLOGY CLINIC PROCEDURE NOTE  Nexplanon Insertion Procedure Patient was given informed consent, she signed consent form.  Patient does understand that irregular bleeding is a very common side effect of this medication. She was advised to have backup contraception for one week after placement. Pregnancy test in clinic today was negative.  Appropriate time out taken.  Patient's left arm was prepped and draped in the usual sterile fashion.. The ruler used to measure and mark  insertion area.  Patient was prepped with alcohol swab and then injected with 3 ml of 1% lidocaine.  She was prepped with betadine, Nexplanon removed from packaging,  Device confirmed in needle, then inserted full length of needle and withdrawn per handbook instructions. Nexplanon was able to palpated in the patient's arm; patient palpated the insert herself. There was minimal blood loss.  Patient insertion site covered with guaze and a pressure bandage to reduce any bruising.  The patient tolerated the procedure well and was given post procedure instructions.    Results for orders placed or performed in visit on 10/13/18 (from the past 24 hour(s))  Pregnancy, urine POC     Status: None   Collection Time: 10/13/18 10:43 AM  Result Value Ref Range   Preg Test, Ur NEGATIVE NEGATIVE    Assessment:     Normal postpartum exam. Pap smear not done at today's visit.  Normal 10/2016.   Plan:    1. Contraception: Nexplanon 2. Advised condom use for the next week as back up  3. Follow up in: 1 year for annual exam or sooner as needed.    Marny Lowenstein, PA-C 10/13/2018 10:39 AM

## 2018-10-13 NOTE — Patient Instructions (Signed)
Etonogestrel implant What is this medicine? ETONOGESTREL (et oh noe JES trel) is a contraceptive (birth control) device. It is used to prevent pregnancy. It can be used for up to 3 years. This medicine may be used for other purposes; ask your health care provider or pharmacist if you have questions. COMMON BRAND NAME(S): Implanon, Nexplanon What should I tell my health care provider before I take this medicine? They need to know if you have any of these conditions: -abnormal vaginal bleeding -blood vessel disease or blood clots -breast, cervical, endometrial, ovarian, liver, or uterine cancer -diabetes -gallbladder disease -heart disease or recent heart attack -high blood pressure -high cholesterol or triglycerides -kidney disease -liver disease -migraine headaches -seizures -stroke -tobacco smoker -an unusual or allergic reaction to etonogestrel, anesthetics or antiseptics, other medicines, foods, dyes, or preservatives -pregnant or trying to get pregnant -breast-feeding How should I use this medicine? This device is inserted just under the skin on the inner side of your upper arm by a health care professional. Talk to your pediatrician regarding the use of this medicine in children. Special care may be needed. Overdosage: If you think you have taken too much of this medicine contact a poison control center or emergency room at once. NOTE: This medicine is only for you. Do not share this medicine with others. What if I miss a dose? This does not apply. What may interact with this medicine? Do not take this medicine with any of the following medications: -amprenavir -fosamprenavir This medicine may also interact with the following medications: -acitretin -aprepitant -armodafinil -bexarotene -bosentan -carbamazepine -certain medicines for fungal infections like fluconazole, ketoconazole, itraconazole and voriconazole -certain medicines to treat hepatitis, HIV or  AIDS -cyclosporine -felbamate -griseofulvin -lamotrigine -modafinil -oxcarbazepine -phenobarbital -phenytoin -primidone -rifabutin -rifampin -rifapentine -St. John's wort -topiramate This list may not describe all possible interactions. Give your health care provider a list of all the medicines, herbs, non-prescription drugs, or dietary supplements you use. Also tell them if you smoke, drink alcohol, or use illegal drugs. Some items may interact with your medicine. What should I watch for while using this medicine? This product does not protect you against HIV infection (AIDS) or other sexually transmitted diseases. You should be able to feel the implant by pressing your fingertips over the skin where it was inserted. Contact your doctor if you cannot feel the implant, and use a non-hormonal birth control method (such as condoms) until your doctor confirms that the implant is in place. Contact your doctor if you think that the implant may have broken or become bent while in your arm. You will receive a user card from your health care provider after the implant is inserted. The card is a record of the location of the implant in your upper arm and when it should be removed. Keep this card with your health records. What side effects may I notice from receiving this medicine? Side effects that you should report to your doctor or health care professional as soon as possible: -allergic reactions like skin rash, itching or hives, swelling of the face, lips, or tongue -breast lumps, breast tissue changes, or discharge -breathing problems -changes in emotions or moods -if you feel that the implant may have broken or bent while in your arm -high blood pressure -pain, irritation, swelling, or bruising at the insertion site -scar at site of insertion -signs of infection at the insertion site such as fever, and skin redness, pain or discharge -signs and symptoms of a blood clot such   as breathing  problems; changes in vision; chest pain; severe, sudden headache; pain, swelling, warmth in the leg; trouble speaking; sudden numbness or weakness of the face, arm or leg -signs and symptoms of liver injury like dark yellow or brown urine; general ill feeling or flu-like symptoms; light-colored stools; loss of appetite; nausea; right upper belly pain; unusually weak or tired; yellowing of the eyes or skin -unusual vaginal bleeding, discharge Side effects that usually do not require medical attention (report to your doctor or health care professional if they continue or are bothersome): -acne -breast pain or tenderness -headache -irregular menstrual bleeding -nausea This list may not describe all possible side effects. Call your doctor for medical advice about side effects. You may report side effects to FDA at 1-800-FDA-1088. Where should I keep my medicine? This drug is given in a hospital or clinic and will not be stored at home. NOTE: This sheet is a summary. It may not cover all possible information. If you have questions about this medicine, talk to your doctor, pharmacist, or health care provider.  2019 Elsevier/Gold Standard (2017-06-23 14:11:42) Nexplanon Instructions After Insertion   Keep bandage clean and dry for 24 hours   May use ice/Tylenol/Ibuprofen for soreness or pain   If you develop fever, drainage or increased warmth from incision site-contact office immediately   

## 2018-10-14 LAB — GLUCOSE TOLERANCE, 2 HOURS
GLUCOSE, 2 HOUR: 126 mg/dL (ref 65–139)
Glucose, GTT - Fasting: 81 mg/dL (ref 65–99)

## 2018-11-05 ENCOUNTER — Encounter: Payer: Self-pay | Admitting: *Deleted

## 2018-11-10 ENCOUNTER — Encounter: Payer: Self-pay | Admitting: *Deleted

## 2019-02-11 IMAGING — US US MFM FETAL BPP W/O NON-STRESS
1 series · 13 of 28 positions shown · non-contrast
Comparison: none

[Series 1: us mfm fetal bpp w/o non-stress · 81 acquisitions, 13 frames shown]
[im 3/81]
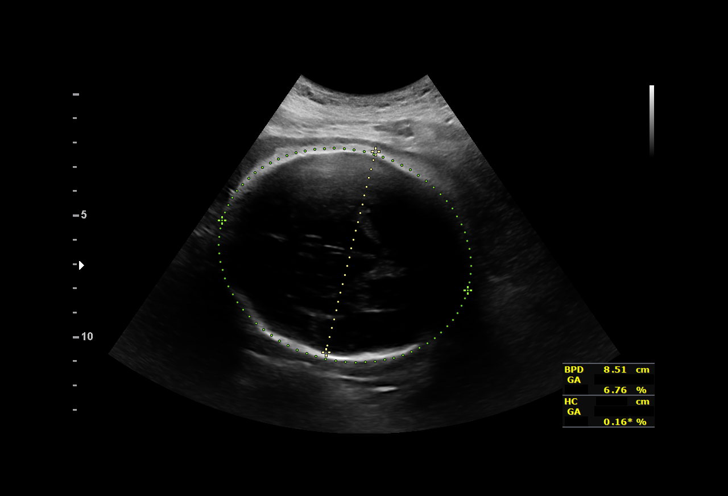
[im 9/81]
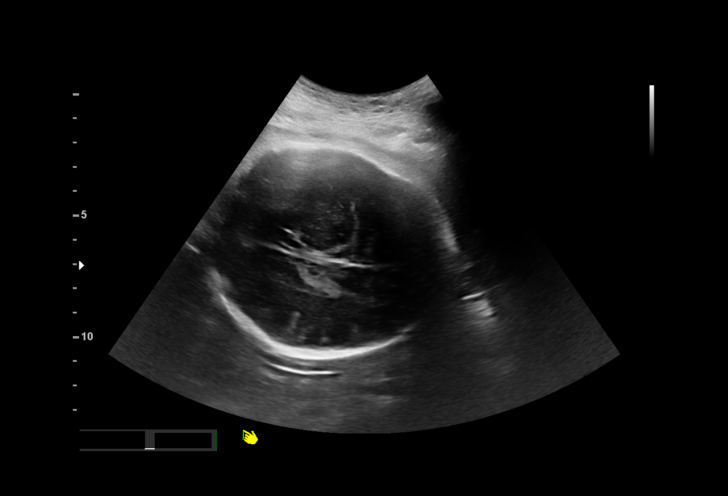
[im 15/81]
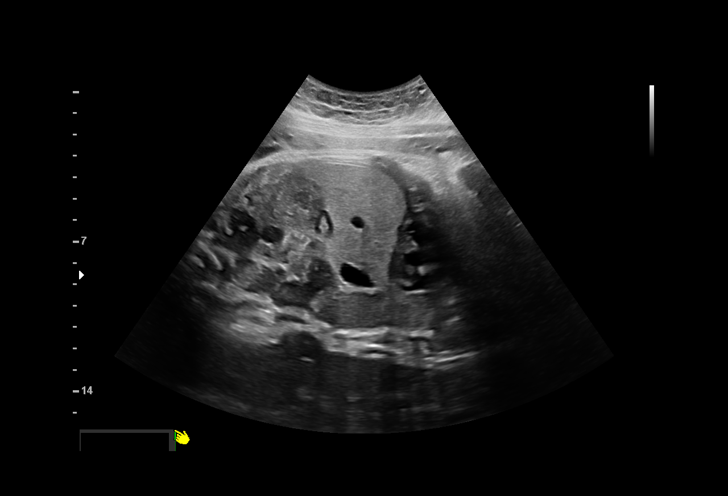
[im 21/81]
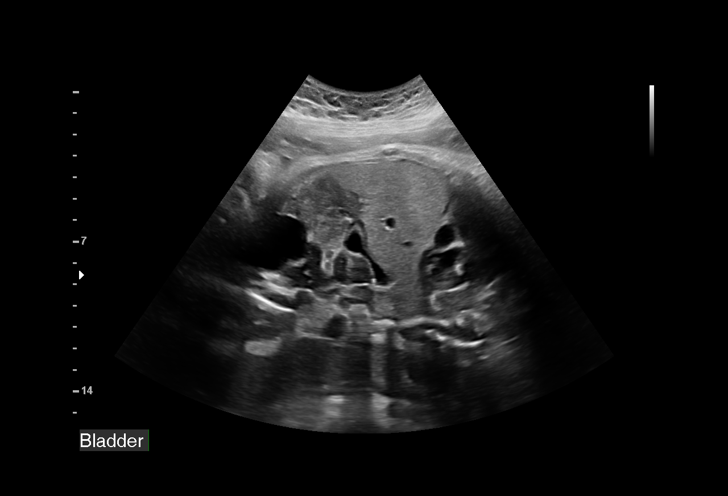
[im 27/81]
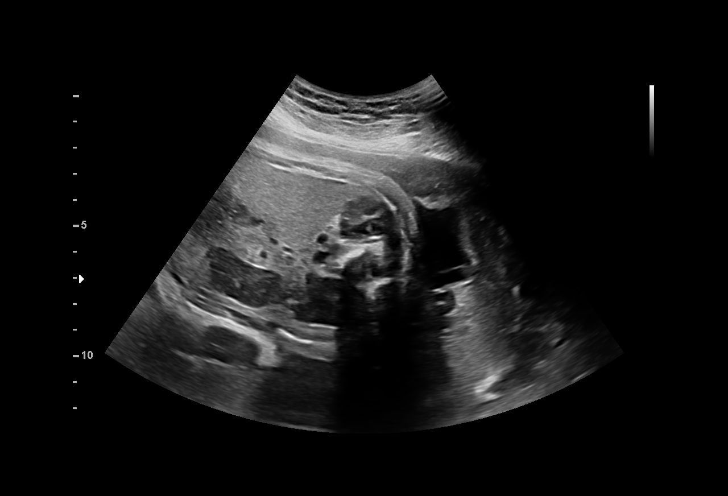
[im 33/81]
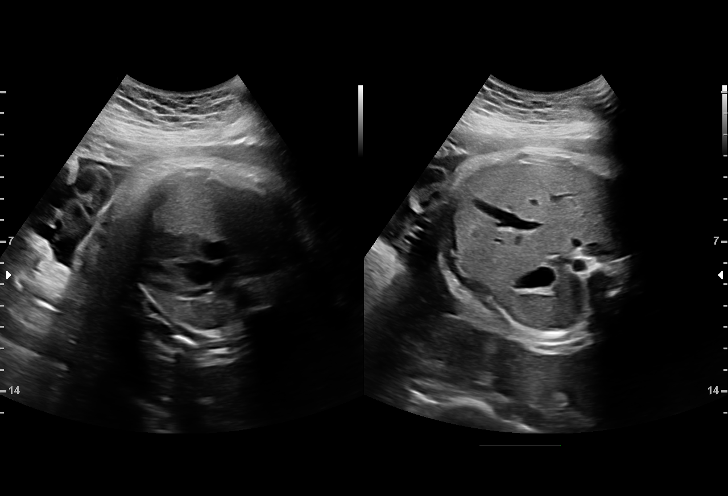
[im 42/81]
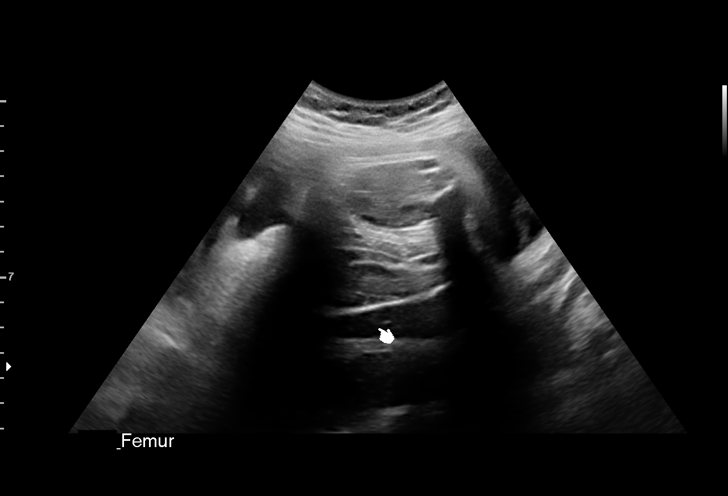
[im 48/81]
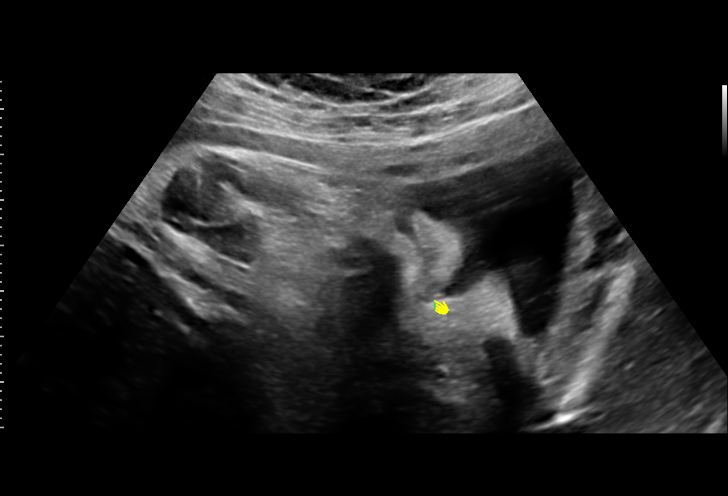
[im 54/81]
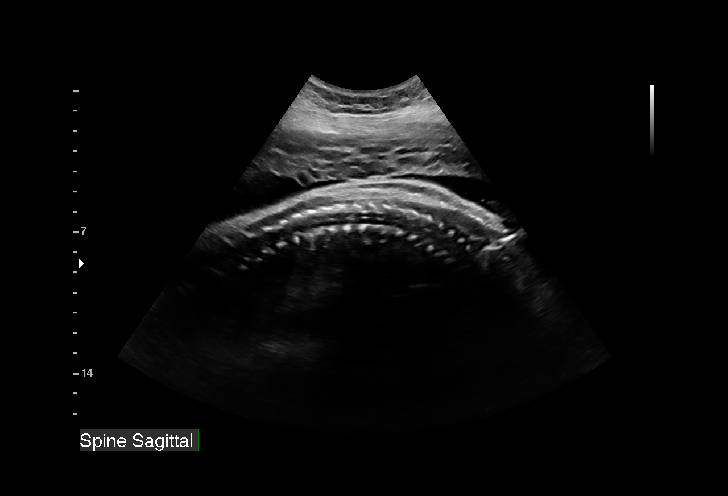
[im 60/81]
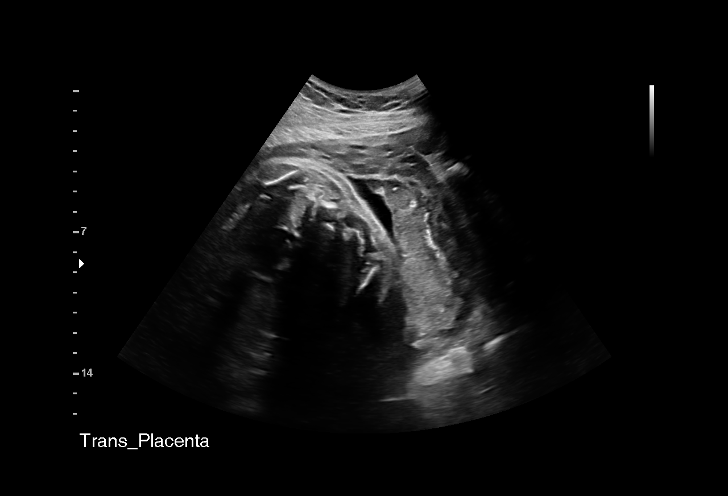
[im 66/81]
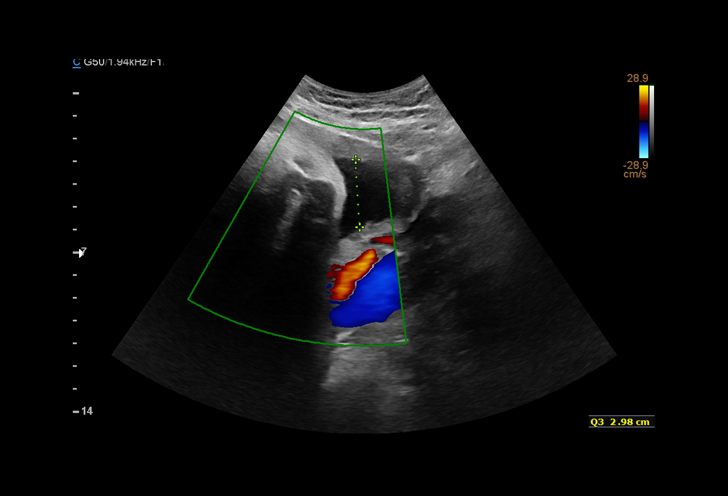
[im 72/81]
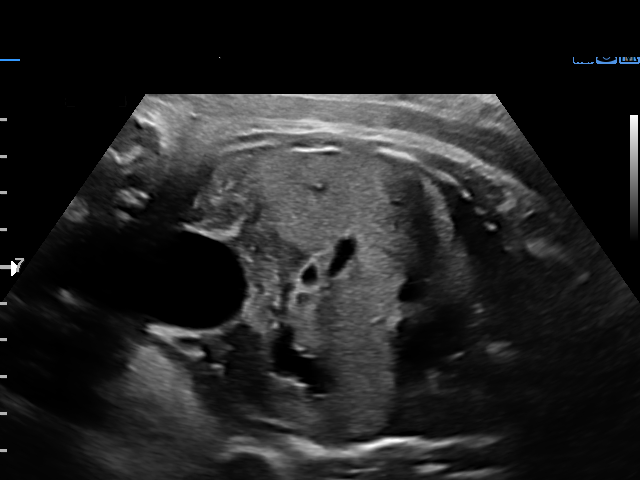
[im 78/81]
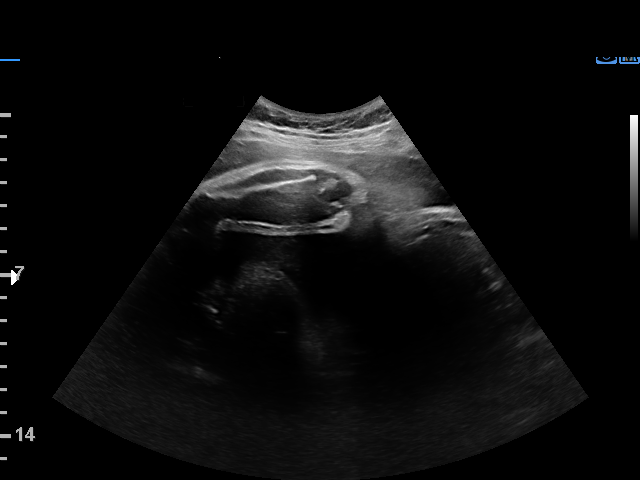

[13 of 28 positions shown; findings below may reference images not displayed]

[REDACTED]. [HOSPITAL],
                   JAMPPE CNM

  1  US MFM OB DETAIL +14 WK              76811.01     OUSEMA AMICHE
 ----------------------------------------------------------------------

 ----------------------------------------------------------------------
Indications

  Hypertension - Chronic/Pre-existing
  36 weeks gestation of pregnancy
  Poor obstetric history (prior pre-term
  delivery, twins @ 75w4d; one twin died @ 5
  days old)
  Poor obstetric history: Previous gestational
  diabetes
  History of cesarean delivery, currently
  pregnant
  Encounter for antenatal screening for
  malformations
  Late to prenatal care, third trimester
 ----------------------------------------------------------------------
Vital Signs

 BMI:         31.1         Pulse:  99
 BP:          126/85
Fetal Evaluation

 Num Of Fetuses:         1
 Fetal Heart Rate(bpm):  127
 Cardiac Activity:       Observed
 Presentation:           Cephalic
 Placenta:               Posterior
 P. Cord Insertion:      Not well visualized
 Amniotic Fluid
 AFI FV:      Within normal limits

 AFI Sum(cm)     %Tile       Largest Pocket(cm)
 9.95            23

 RUQ(cm)       RLQ(cm)       LUQ(cm)        LLQ(cm)

Biophysical Evaluation

 Amniotic F.V:   Within normal limits       F. Tone:        Observed
 F. Movement:    Observed                   Score:          [DATE]
 F. Breathing:   Observed
Biometry

 BPD:      84.9  mm     G. Age:  34w 1d          6  %    CI:        77.58   %    70 - 86
                                                         FL/HC:      21.9   %    20.8 -
 HC:      305.1  mm     G. Age:  33w 6d        < 3  %    HC/AC:      0.94        0.92 -
 AC:      323.4  mm     G. Age:  36w 2d         49  %    FL/BPD:     78.6   %    71 - 87
 FL:       66.7  mm     G. Age:  34w 2d          5  %    FL/AC:      20.6   %    20 - 24
 HUM:      59.1  mm     G. Age:  34w 1d         23  %
 LV:        3.7  mm

 Est. FW:    5541  gm    5 lb 13 oz      34  %
OB History

 Gravidity:    4         Term:   2        Prem:   1
 Living:       3
Gestational Age

 LMP:           36w 5d        Date:  11/20/17                 EDD:   08/27/18
 U/S Today:     34w 5d                                        EDD:   09/10/18
 Best:          36w 5d     Det. By:  LMP  (11/20/17)          EDD:   08/27/18
Anatomy

 Cranium:               Appears normal         LVOT:                   Appears normal
 Cavum:                 Appears normal         Aortic Arch:            Not well visualized
 Ventricles:            Appears normal         Ductal Arch:            Not well visualized
 Choroid Plexus:        Appears normal         Diaphragm:              Appears normal
 Cerebellum:            Not well visualized    Stomach:                Appears normal, left
                                                                       sided
 Posterior Fossa:       Not well visualized    Abdomen:                Appears normal
 Nuchal Fold:           Not applicable (>20    Abdominal Wall:         Not well visualized
                        wks GA)
 Face:                  Orbits nl; profile not Cord Vessels:           Appears normal (3
                        well visualized                                vessel cord)
 Lips:                  Appears normal         Kidneys:                Appear normal
 Palate:                Not well visualized    Bladder:                Appears normal
 Thoracic:              Appears normal         Spine:                  Ltd views no
                                                                       intracranial signs of
                                                                       NT
 Heart:                 Appears normal         Upper Extremities:      RUE Visualized
                        (4CH, axis, and situs
 RVOT:                  Not well visualized    Lower Extremities:      Visualized

 Other:  Technically difficult due to advanced GA and fetal position.
Cervix Uterus Adnexa

 Cervix
 Not visualized (advanced GA >78wks)

 Uterus
 No abnormality visualized.

 Left Ovary
 Not visualized.

 Right Ovary
 Not visualized.
Comments

 U/S images reviewed. Findings reviewed with patient.
 Appropriate fetal growth is noted.  No fetal abnormalities are
 identified.  No evidence of fetal compromise is found on BPP
 today.
 BP - 126/85. Patient denies headaches, visual disturbance,
 mid-epigastric or RUQ pain.
 Questions answered.  Translator utilized.
 10 minutes spent face to face with patient.
 Recommendations: 1) Serial U/S every 4 weeks for fetal
 growth 2) Weekly BPP
Recommendations

 1) Serial U/S every 4 weeks for fetal growth 2) Weekly BPP
              Melody, Parul

## 2019-02-24 IMAGING — US US MFM FETAL BPP W/O NON-STRESS
1 series · 14 of 14 positions shown · non-contrast
Comparison: none

[Series 1: us mfm fetal bpp w/o non-stress · 14 acquisitions, 14 frames shown]
[im 1/14]
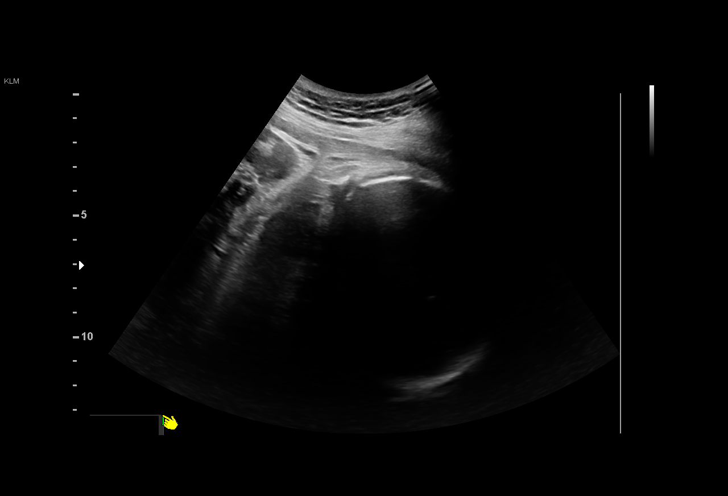
[im 2/14]
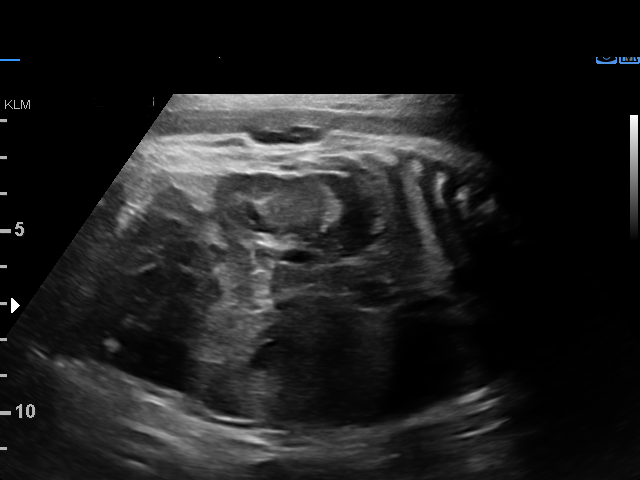
[im 3/14]
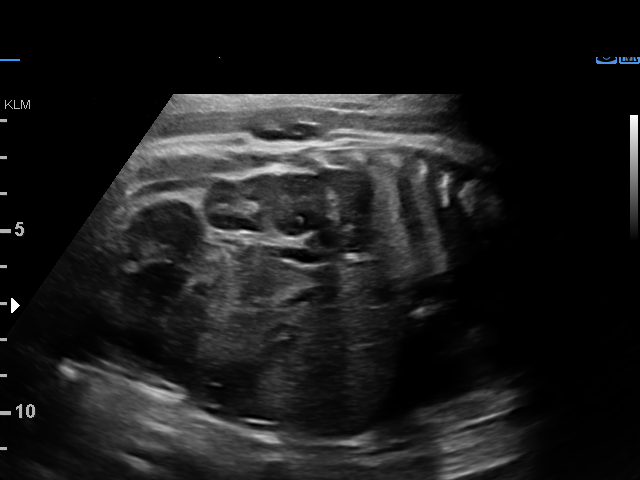
[im 4/14]
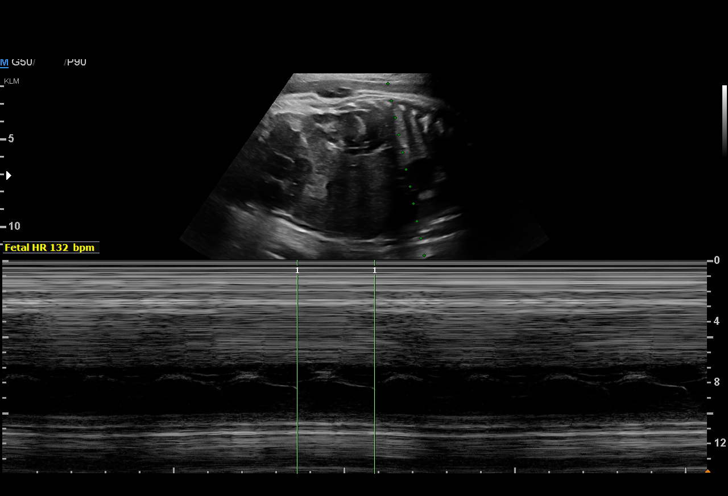
[im 5/14]
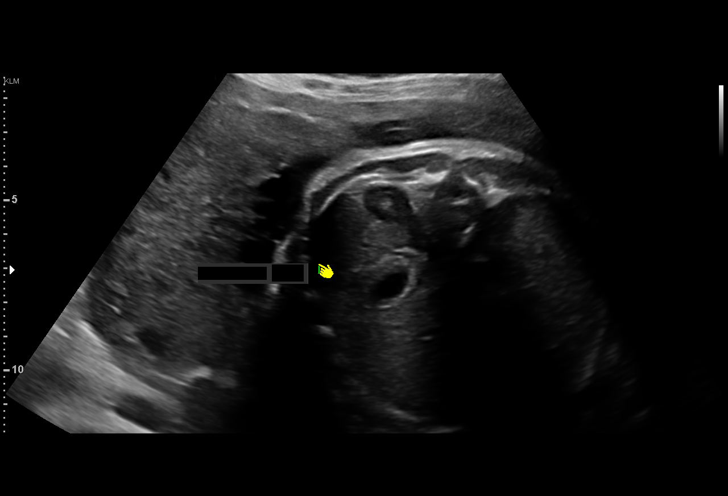
[im 6/14]
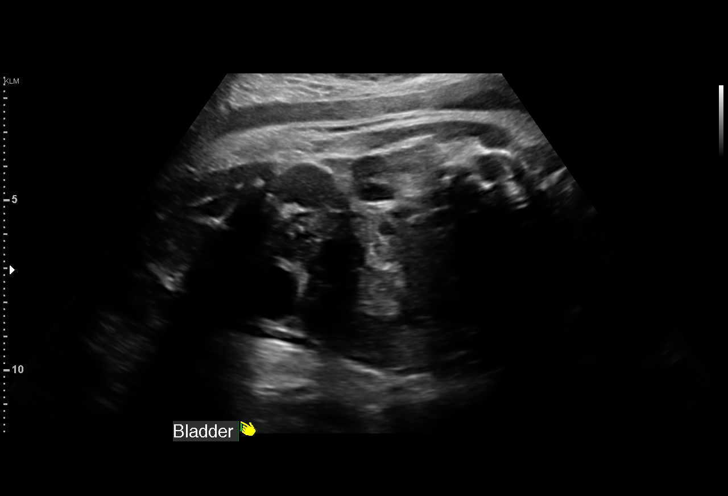
[im 7/14]
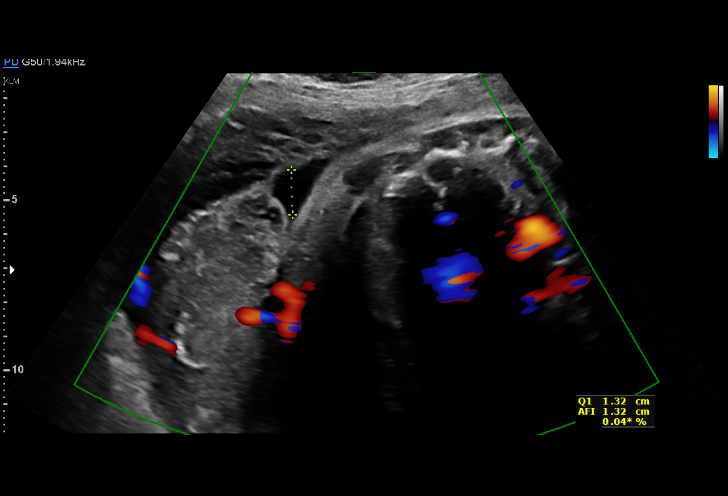
[im 8/14]
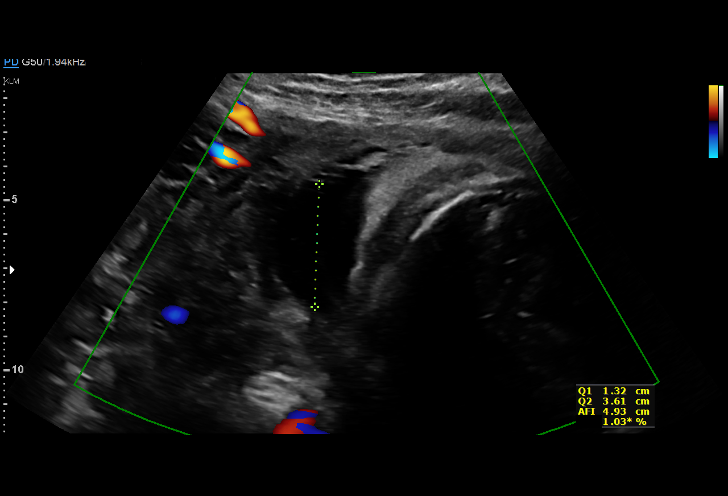
[im 9/14]
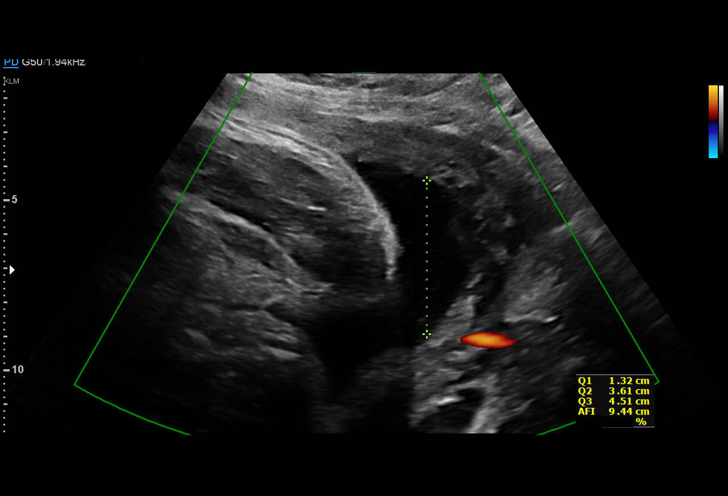
[im 10/14]
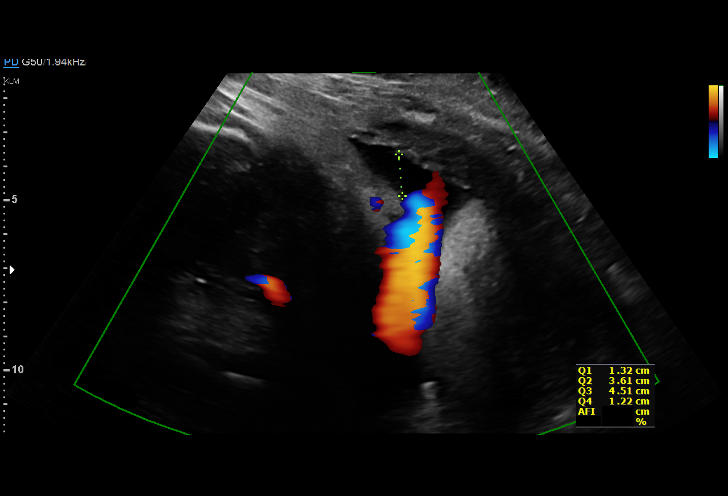
[im 11/14]
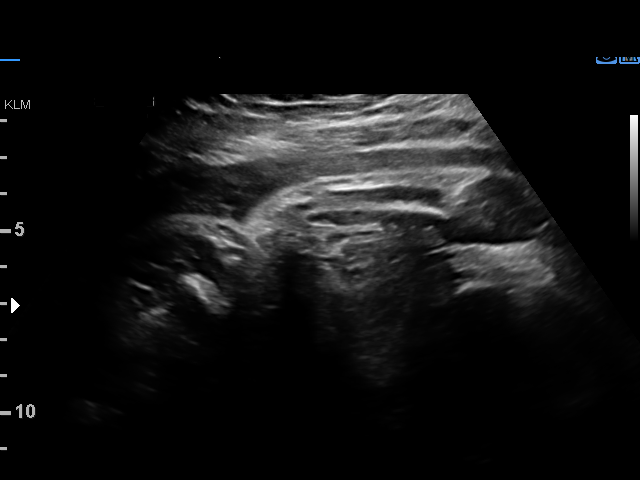
[im 12/14]
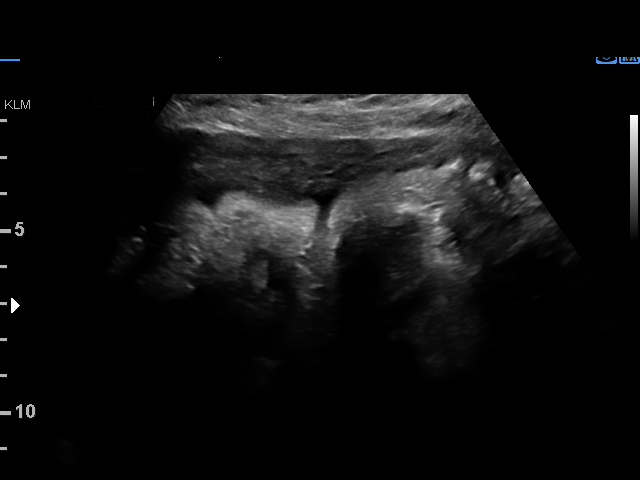
[im 13/14]
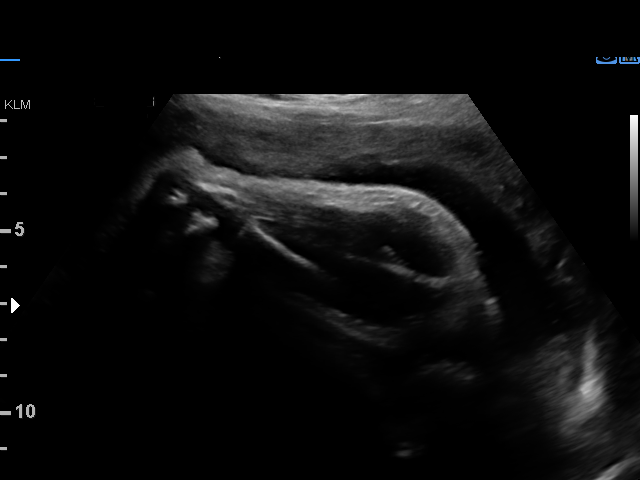
[im 14/14]
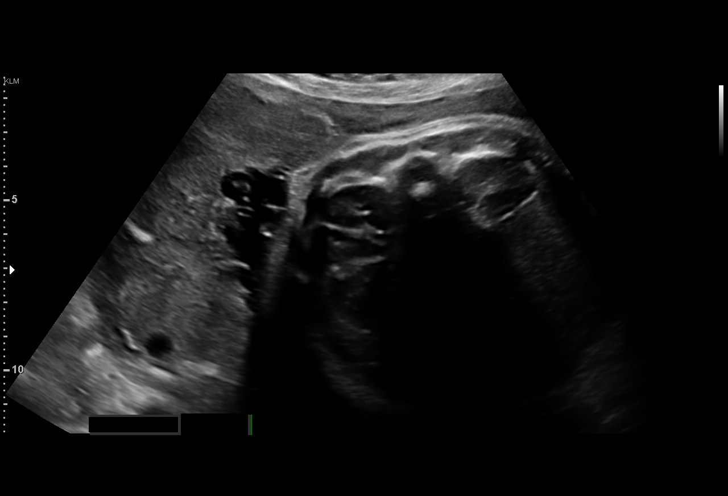

[14 of 14 positions shown; findings below may reference images not displayed]

OB/Gyn Clinic

 ----------------------------------------------------------------------

 ----------------------------------------------------------------------
Indications

  Hypertension - Chronic/Pre-existing ( no
  meds)
  38 weeks gestation of pregnancy
  Poor obstetric history (prior pre-term
  delivery, twins @ 61w3d; one twin died @ 5
  days old)
  Poor obstetric history: Previous gestational
  diabetes
  History of cesarean delivery, currently
  pregnant
  Late to prenatal care, third trimester
 ----------------------------------------------------------------------
Vital Signs

                                                Height:        4'11"
Fetal Evaluation

 Num Of Fetuses:          1
 Fetal Heart              132
 Rate(bpm):
 Cardiac Activity:        Observed
 Presentation:            Cephalic
 Placenta:                Posterior
 Amniotic Fluid
 AFI FV:      Within normal limits

 AFI Sum(cm)     %Tile       Largest Pocket(cm)
 10.66           32

 RUQ(cm)       RLQ(cm)       LUQ(cm)        LLQ(cm)

Biophysical Evaluation

 Amniotic F.V:   Within normal limits       F. Tone:         Observed
 F. Movement:    Observed                   Score:           [DATE]
 F. Breathing:   Observed
OB History

 Gravidity:    4         Term:   2        Prem:   1
 Living:       3
Gestational Age

 LMP:           38w 4d        Date:  11/20/17                 EDD:    08/27/18
 Best:          38w 4d     Det. By:  LMP  (11/20/17)          EDD:    08/27/18
Anatomy

 Stomach:               Appears normal,        Bladder:                Appears normal
                        left sided
 Kidneys:               Appear normal
Impression

 Biophyscial profile [DATE]
Recommendations

 Follow up as clinically indicated.

## 2019-12-09 ENCOUNTER — Ambulatory Visit: Payer: Medicaid Other | Admitting: Medical

## 2019-12-28 ENCOUNTER — Ambulatory Visit: Payer: Medicaid Other | Admitting: Women's Health

## 2020-01-18 ENCOUNTER — Ambulatory Visit: Payer: Medicaid Other | Admitting: Obstetrics & Gynecology

## 2020-02-27 ENCOUNTER — Ambulatory Visit: Payer: Medicaid Other | Admitting: Obstetrics and Gynecology

## 2020-04-09 ENCOUNTER — Other Ambulatory Visit: Payer: Self-pay

## 2020-04-09 ENCOUNTER — Encounter: Payer: Self-pay | Admitting: Certified Nurse Midwife

## 2020-04-09 ENCOUNTER — Ambulatory Visit (INDEPENDENT_AMBULATORY_CARE_PROVIDER_SITE_OTHER): Payer: Medicaid Other | Admitting: Certified Nurse Midwife

## 2020-04-09 ENCOUNTER — Other Ambulatory Visit (HOSPITAL_COMMUNITY)
Admission: RE | Admit: 2020-04-09 | Discharge: 2020-04-09 | Disposition: A | Discharge status: Home / Self Care | Payer: Medicaid Other | Source: Ambulatory Visit | Attending: Medical | Admitting: Medical

## 2020-04-09 DIAGNOSIS — Z01419 Encounter for gynecological examination (general) (routine) without abnormal findings: Secondary | ICD-10-CM

## 2020-04-09 NOTE — Progress Notes (Signed)
Here for annual exam. Has nexplanon in 10/13/18/

## 2020-04-09 NOTE — Progress Notes (Signed)
GYNECOLOGY CLINIC ANNUAL PREVENTATIVE CARE ENCOUNTER NOTE  Subjective:   Haley Griffin is a 34 y.o. [phone] female here for a routine annual gynecologic exam.  Current complaints: None.   Denies abnormal vaginal bleeding, discharge, pelvic pain, problems with intercourse or other gynecologic concerns.    Gynecologic History No LMP recorded. Contraception: Nexplanon Last Pap: 10/25/2016. Results were: normal Last mammogram:N/A  Obstetric History OB History  Gravida Para Term Preterm AB Living  4 4 3 1   4   SAB TAB Ectopic Multiple Live Births        1 5    # Outcome Date GA Lbr Len/2nd Weight Sex Delivery Anes PTL Lv  4 Term 08/18/18 [redacted]w[redacted]d 00:36 / 00:06 6 lb 5.2 oz (2.869 kg) F VBAC None  LIV  3A Preterm 12/19/16 [redacted]w[redacted]d  1 lb 6.2 oz (0.63 kg) F CS-LTranv Spinal  LIV     Complications: Preterm premature rupture of membranes (PPROM) with onset of labor after 24 hours of rupture in second trimester, antepartum, Breech birth, fetus 1  3B Preterm 12/19/16 [redacted]w[redacted]d  1 lb 6.9 oz (0.65 kg) F CS-LTranv Spinal Y ND     Birth Comments: Died at 13 days old     Complications: Preterm premature rupture of membranes (PPROM) with onset of labor after 24 hours of rupture in second trimester, antepartum  2 Term 08/06/09 [redacted]w[redacted]d   M Vag-Spont None N LIV  1 Term 08/22/03    M Vag-Spont None N LIV    Past Medical History:  Diagnosis Date  . Gestational diabetes   . History of gestational diabetes 11/19/2016   Borderline results on early 3hr OGTT> Saw dietician and checking CBGs  . History of preterm delivery 12/16/2016  . Hypertension     Past Surgical History:  Procedure Laterality Date  . CESAREAN SECTION    . CESAREAN SECTION MULTI-GESTATIONAL N/A 12/19/2016   Procedure: CESAREAN SECTION MULTI-GESTATIONAL;  Surgeon: 02/18/2017, MD;  Location: [facility] BIRTHING SU[facility];  Service: Obstetrics;  Laterality: N/A;    Current Outpatient Medications on File Prior to Visit  Medication Sig Dispense Refill  .  ibuprofen (ADVIL,MOTRIN) 600 MG tablet Take 1 tablet (600 mg total) by mouth every 6 (six) hours. 60 tablet 1  . prenatal vitamin w/FE, FA (PRENATAL 1 + 1) 27-1 MG TABS tablet Take 1 tablet by mouth daily at 12 noon.    . senna-docusate (SENOKOT-S) 8.6-50 MG tablet Take 2 tablets by mouth daily. (Patient not taking: Reported on 10/13/2018) 20 tablet 0   No current facility-administered medications on file prior to visit.    No Known Allergies  Social History   Socioeconomic History  . Marital status: Married    Spouse name: Not on file  . Number of children: Not on file  . Years of education: Not on file  . Highest education level: Not on file  Occupational History  . Not on file  Tobacco Use  . Smoking status: Never Smoker  . Smokeless tobacco: Never Used  Vaping Use  . Vaping Use: Never used  Substance and Sexual Activity  . Alcohol use: No  . Drug use: No  . Sexual activity: Yes    Birth control/protection: Implant  Other Topics Concern  . Not on file  Social History Narrative  . Not on file   Social Determinants of Health   Financial Resource Strain:   . Difficulty of Paying Living Expenses: Not on file  Food Insecurity: Food Insecurity Present  .  Worried About Programme researcher, broadcasting/film/video in the Last Year: Sometimes true  . Ran Out of Food in the Last Year: Sometimes true  Transportation Needs: No Transportation Needs  . Lack of Transportation (Medical): No  . Lack of Transportation (Non-Medical): No  Physical Activity:   . Days of Exercise per Week: Not on file  . Minutes of Exercise per Session: Not on file  Stress:   . Feeling of Stress : Not on file  Social Connections:   . Frequency of Communication with Friends and Family: Not on file  . Frequency of Social Gatherings with Friends and Family: Not on file  . Attends Religious Services: Not on file  . Active Member of Clubs or Organizations: Not on file  . Attends Banker Meetings: Not on file  .  Marital Status: Not on file  Intimate Partner Violence:   . Fear of Current or Ex-Partner: Not on file  . Emotionally Abused: Not on file  . Physically Abused: Not on file  . Sexually Abused: Not on file    Family History  Problem Relation Age of Onset  . Diabetes Mother     The following portions of the patient's history were reviewed and updated as appropriate: allergies, current medications, past family history, past medical history, past social history, past surgical history and problem list.  Review of Systems A comprehensive review of systems was negative.   Objective:  There were no vitals taken for this visit. CONSTITUTIONAL: Well-developed, well-nourished female in no acute distress.  HENT:  Normocephalic, atraumatic, External right and left ear normal. Oropharynx is clear and moist EYES: Conjunctivae and EOM are normal. Pupils are equal, round, and reactive to light. No scleral icterus.  NECK: Normal range of motion, supple, no masses.  Normal thyroid.  SKIN: Skin is warm and dry. No rash noted. Not diaphoretic. No erythema. No pallor. NEUROLGIC: Alert and oriented to person, place, and time. Normal reflexes, muscle tone coordination. No cranial nerve deficit noted. PSYCHIATRIC: Normal mood and affect. Normal behavior. Normal judgment and thought content. CARDIOVASCULAR: Normal heart rate noted, regular rhythm RESPIRATORY: Clear to auscultation bilaterally. Effort and breath sounds normal, no problems with respiration noted. BREASTS: Symmetric in size. No masses, skin changes, nipple drainage, or lymphadenopathy. ABDOMEN: Soft, normal bowel sounds, no distention noted.  No tenderness, rebound or guarding.  PELVIC: Normal appearing external genitalia; normal appearing vaginal mucosa and cervix.  No abnormal discharge noted.  Pap smear obtained.  Normal uterine size, no other palpable masses, no uterine or adnexal tenderness. MUSCULOSKELETAL: Normal range of motion. No  tenderness.  No cyanosis, clubbing, or edema.  2+ distal pulses.   Assessment:  Annual gynecologic examination with pap smear   Plan:  Will follow up results of pap smear and manage accordingly. Routine preventative health maintenance measures emphasized. Please refer to After Visit Summary for other counseling recommendations.   Edd Arbour, CNM, MSN, [facility] 04/09/20 11:41 AM

## 2020-04-11 LAB — CYTOLOGY - PAP
Comment: NEGATIVE
Diagnosis: NEGATIVE
Diagnosis: REACTIVE
High risk HPV: NEGATIVE

## 2021-11-11 ENCOUNTER — Ambulatory Visit: Payer: Medicaid Other | Admitting: Family Medicine

## 2021-11-11 ENCOUNTER — Encounter: Payer: Self-pay | Admitting: Family Medicine

## 2021-11-11 ENCOUNTER — Other Ambulatory Visit: Payer: Self-pay

## 2021-11-11 VITALS — BP 146/94 | HR 105 | Wt 159.6 lb

## 2021-11-11 DIAGNOSIS — I10 Essential (primary) hypertension: Secondary | ICD-10-CM

## 2021-11-11 DIAGNOSIS — Z789 Other specified health status: Secondary | ICD-10-CM

## 2021-11-11 DIAGNOSIS — Z3046 Encounter for surveillance of implantable subdermal contraceptive: Secondary | ICD-10-CM | POA: Diagnosis not present

## 2021-11-11 MED ORDER — ETONOGESTREL 68 MG ~~LOC~~ IMPL
68.0000 mg | DRUG_IMPLANT | Freq: Once | SUBCUTANEOUS | Status: AC
  Administered 2021-11-11: 68 mg via SUBCUTANEOUS

## 2021-11-11 NOTE — Progress Notes (Signed)
? ?  Subjective:  ? ? Patient ID: Haley Griffin is a 36 y.o. female presenting with Annual Exam ? on 11/11/2021 ? ?HPI: ?Needs her Nexplanon removed. Placed 09/2018 and is due for removal. ? ?[place] interpreter: in person used ? ?Review of Systems  ?Constitutional:  Negative for chills and fever.  ?Respiratory:  Negative for shortness of breath.   ?Cardiovascular:  Negative for chest pain.  ?Gastrointestinal:  Negative for abdominal pain, nausea and vomiting.  ?Genitourinary:  Negative for dysuria.  ?Skin:  Negative for rash.  ?   ?Objective:  ?  ?BP (!) 146/94   Pulse (!) 105   Wt 159 lb 9.6 oz (72.4 kg)   Breastfeeding No   BMI 32.24 kg/m?  ?Physical Exam ?Exam conducted with a chaperone present.  ?Constitutional:   ?   General: She is not in acute distress. ?   Appearance: She is well-developed.  ?HENT:  ?   Head: Normocephalic and atraumatic.  ?Eyes:  ?   General: No scleral icterus. ?Cardiovascular:  ?   Rate and Rhythm: Normal rate.  ?Pulmonary:  ?   Effort: Pulmonary effort is normal.  ?Abdominal:  ?   Palpations: Abdomen is soft.  ?Musculoskeletal:  ?   Cervical back: Neck supple.  ?Skin: ?   General: Skin is warm and dry.  ?Neurological:  ?   Mental Status: She is alert and oriented to person, place, and time.  ? ?Procedure: ?Patient given informed consent for removal of her Nexplanon.   Appropriate time out taken. Nexplanon site identified.  Area prepped in usual sterile fashon. Three cc of 1% lidocaine was used to anesthetize the area at the distal end of the implant. A small stab incision was made right beside the implant on the distal portion.  The Nexplanon rod was grasped using hemostats and removed without difficulty.  ? ?Procedure: ?Nexplanon removed form packaging,  Device confirmed in needle, then inserted full length of needle and withdrawn per handbook instructions. A small amount of antibiotic ointment and steri-strips were applied over the small incision.  A pressure bandage was applied to reduce  any bruising.  The patient tolerated the procedure well and was given post procedure instructions. Minimal blood loss.  Pt tolerated the procedure well.  ? ? ?   ?Assessment & Plan:  ?Language barrier to communication ? ?Chronic hypertension - referral to PCP - Plan: Ambulatory referral to Family Practice ? ?Encounter for removal and reinsertion of Nexplanon - Works well for her--changed and good for 3 more years. - Plan: etonogestrel (NEXPLANON) implant 68 mg ? ? ?Return in about 1 year (around 11/12/2022) for a CPE. ? ?Donnamae Jude, MD ?11/11/2021 ?1:51 PM ? ? ?

## 2022-01-15 ENCOUNTER — Ambulatory Visit: Payer: Medicaid Other | Admitting: Family Medicine

## 2022-01-15 ENCOUNTER — Encounter: Payer: Self-pay | Admitting: Family Medicine

## 2022-01-15 VITALS — BP 136/99 | HR 94 | Temp 98.1°F | Resp 16 | Ht 60.0 in | Wt 159.0 lb

## 2022-01-15 DIAGNOSIS — I10 Essential (primary) hypertension: Secondary | ICD-10-CM

## 2022-01-15 DIAGNOSIS — R519 Headache, unspecified: Secondary | ICD-10-CM | POA: Diagnosis not present

## 2022-01-15 DIAGNOSIS — Z789 Other specified health status: Secondary | ICD-10-CM | POA: Diagnosis not present

## 2022-01-15 MED ORDER — LOSARTAN POTASSIUM 50 MG PO TABS: 50.0000 mg | ORAL_TABLET | Freq: Every day | ORAL | 0 refills | Status: DC

## 2022-01-16 NOTE — Progress Notes (Signed)
New Patient Office Visit  Subjective    Patient ID: Haley Griffin, female    DOB: [birth year]  Age: 36 y.o. MRN: [id number]  CC:  Chief Complaint  Patient presents with   Establish Care    HPI Haley Griffin presents to establish care and for complaint of elevated blood pressure readings and headaches. This visit was aided by an interpreter.    Outpatient Encounter Medications as of 01/15/2022  Medication Sig   losartan (COZAAR) 50 MG tablet Take 1 tablet (50 mg total) by mouth daily.   [DISCONTINUED] ibuprofen (ADVIL,MOTRIN) 600 MG tablet Take 1 tablet (600 mg total) by mouth every 6 (six) hours.   [DISCONTINUED] prenatal vitamin w/FE, FA (PRENATAL 1 + 1) 27-1 MG TABS tablet Take 1 tablet by mouth daily at 12 noon.   [DISCONTINUED] senna-docusate (SENOKOT-S) 8.6-50 MG tablet Take 2 tablets by mouth daily.   No facility-administered encounter medications on file as of 01/15/2022.    Past Medical History:  Diagnosis Date   Gestational diabetes    History of gestational diabetes 11/19/2016   Borderline results on early 3hr OGTT> Saw dietician and checking CBGs   History of preterm delivery 12/16/2016   Hypertension     Past Surgical History:  Procedure Laterality Date   CESAREAN SECTION     CESAREAN SECTION MULTI-GESTATIONAL N/A 12/19/2016   Procedure: CESAREAN SECTION MULTI-GESTATIONAL;  Surgeon:  Bing, MD;  Location: [facility]IRTH[facility] SU[facility];  Service: Obstetrics;  Laterality: N/A;    Family History  Problem Relation Age of Onset   Diabetes Mother     Social History   Socioeconomic History   Marital status: Married    Spouse name: Not on file   Number of children: Not on file   Years of education: Not on file   Highest education level: Not on file  Occupational History   Not on file  Tobacco Use   Smoking status: Never   Smokeless tobacco: Never  Vaping Use   Vaping Use: Never used  Substance and Sexual Activity   Alcohol use: No   Drug use: No   Sexual activity: Yes     Birth control/protection: Implant  Other Topics Concern   Not on file  Social History Narrative   Not on file   Social Determinants of Health   Financial Resource Strain: Not on file  Food Insecurity: Not on file  Transportation Needs: Not on file  Physical Activity: Not on file  Stress: Not on file  Social Connections: Not on file  Intimate Partner Violence: Not on file    Review of Systems  All other systems reviewed and are negative.      Objective    BP (!) 136/99   Pulse 94   Temp 98.1 F (36.7 C) (Oral)   Resp 16   Ht 5' (1.524 m)   Wt 159 lb (72.1 kg)   SpO2 96%   BMI 31.05 kg/m   Physical Exam Vitals and nursing note reviewed.  Constitutional:      General: She is not in acute distress. Cardiovascular:     Rate and Rhythm: Normal rate and regular rhythm.  Pulmonary:     Effort: Pulmonary effort is normal.     Breath sounds: Normal breath sounds.  Abdominal:     Palpations: Abdomen is soft.     Tenderness: There is no abdominal tenderness.  Musculoskeletal:     Right lower leg: No edema.     Left lower leg: No edema.  Neurological:     General: No focal deficit present.     Mental Status: She is alert and oriented to person, place, and time.        Assessment & Plan:   1. Essential hypertension Elevated readings. Will prescribe losartan 50 mg daily and monitor  2. Nonintractable headache, unspecified chronicity pattern, unspecified headache type ? Above. Tylenol/nsaids prn. Will monitor  3. Language barrier to communication     Return in about 3 weeks (around 02/05/2022) for follow up.   Tommie Raymond, MD

## 2022-02-07 ENCOUNTER — Encounter: Payer: Self-pay | Admitting: Family Medicine

## 2022-02-07 ENCOUNTER — Ambulatory Visit: Payer: Medicaid Other | Admitting: Family Medicine

## 2022-02-07 VITALS — BP 114/81 | HR 87 | Temp 98.1°F | Resp 16 | Ht 61.0 in | Wt 157.6 lb

## 2022-02-07 DIAGNOSIS — I10 Essential (primary) hypertension: Secondary | ICD-10-CM

## 2022-02-07 DIAGNOSIS — Z789 Other specified health status: Secondary | ICD-10-CM | POA: Diagnosis not present

## 2022-02-07 MED ORDER — LOSARTAN POTASSIUM 50 MG PO TABS: 50.0000 mg | ORAL_TABLET | Freq: Every day | ORAL | 1 refills | Status: DC

## 2022-02-07 NOTE — Progress Notes (Signed)
Patient is her for f/u HTN.  Patient said that she has been taking her medication regular and has no concerns.

## 2022-02-08 LAB — CMP14+EGFR
ALT: 18 IU/L (ref 0–32)
AST: 17 IU/L (ref 0–40)
Albumin/Globulin Ratio: 1.9 (ref 1.2–2.2)
Albumin: 4.9 g/dL — ABNORMAL HIGH (ref 3.8–4.8)
Alkaline Phosphatase: 65 IU/L (ref 44–121)
BUN/Creatinine Ratio: 13 (ref 9–23)
BUN: 9 mg/dL (ref 6–20)
Bilirubin Total: 0.5 mg/dL (ref 0.0–1.2)
CO2: 19 mmol/L — ABNORMAL LOW (ref 20–29)
Calcium: 8.9 mg/dL (ref 8.7–10.2)
Chloride: 103 mmol/L (ref 96–106)
Creatinine, Ser: 0.67 mg/dL (ref 0.57–1.00)
Globulin, Total: 2.6 g/dL (ref 1.5–4.5)
Glucose: 84 mg/dL (ref 70–99)
Potassium: 4.4 mmol/L (ref 3.5–5.2)
Sodium: 139 mmol/L (ref 134–144)
Total Protein: 7.5 g/dL (ref 6.0–8.5)
eGFR: 115 mL/min/{1.73_m2} (ref >59)

## 2022-02-10 NOTE — Progress Notes (Signed)
New Patient Office Visit  Subjective    Patient ID: Haley Griffin, female    DOB: 1986/08/09  Age: 36 y.o. MRN: [id number]  CC:  Chief Complaint  Patient presents with   Follow-up   Hypertension    HPI Haley Griffin presents for follow up of hypertension. Patient denies acute complaints or concerns. This visit was aided by an interpreter.    Outpatient Encounter Medications as of 02/07/2022  Medication Sig   losartan (COZAAR) 50 MG tablet Take 1 tablet (50 mg total) by mouth daily.   [DISCONTINUED] losartan (COZAAR) 50 MG tablet Take 1 tablet (50 mg total) by mouth daily.   No facility-administered encounter medications on file as of 02/07/2022.    Past Medical History:  Diagnosis Date   Gestational diabetes    History of gestational diabetes 11/19/2016   Borderline results on early 3hr OGTT> Saw dietician and checking CBGs   History of preterm delivery 12/16/2016   Hypertension     Past Surgical History:  Procedure Laterality Date   CESAREAN SECTION     CESAREAN SECTION MULTI-GESTATIONAL N/A 12/19/2016   Procedure: CESAREAN SECTION MULTI-GESTATIONAL;  Surgeon: Marianna Bing, MD;  Location: [facility];  Service: Obstetrics;  Laterality: N/A;    Family History  Problem Relation Age of Onset   Diabetes Mother     Social History   Socioeconomic History   Marital status: Married    Spouse name: Not on file   Number of children: Not on file   Years of education: Not on file   Highest education level: Not on file  Occupational History   Not on file  Tobacco Use   Smoking status: Never   Smokeless tobacco: Never  Vaping Use   Vaping Use: Never used  Substance and Sexual Activity   Alcohol use: No   Drug use: No   Sexual activity: Yes    Birth control/protection: Implant  Other Topics Concern   Not on file  Social History Narrative   Not on file   Social Determinants of Health   Financial Resource Strain: Low Risk  (08/18/2018)   Overall Financial Resource  Strain (CARDIA)    Difficulty of Paying Living Expenses: Not hard at all  Food Insecurity: Food Insecurity Present (04/09/2020)   Hunger Vital Sign    Worried About Running Out of Food in the Last Year: Sometimes true    Ran Out of Food in the Last Year: Sometimes true  Transportation Needs: No Transportation Needs (04/09/2020)   PRAPARE - Administrator, Civil Service (Medical): No    Lack of Transportation (Non-Medical): No  Physical Activity: Unknown (08/18/2018)   Exercise Vital Sign    Days of Exercise per Week: Patient refused    Minutes of Exercise per Session: Patient refused  Stress: No Stress Concern Present (08/18/2018)   [facility] [employer] - Occupational Stress Questionnaire    Feeling of Stress : Not at all  Social Connections: Unknown (08/18/2018)   Social Connection and Isolation Panel [NHANES]    Frequency of Communication with Friends and Family: Patient refused    Frequency of Social Gatherings with Friends and Family: Patient refused    Attends Religious Services: Patient refused    Active Member of Clubs or Organizations: Patient refused    Attends Banker Meetings: Patient refused    Marital Status: Patient refused  Intimate Partner Violence: Unknown (08/18/2018)   Humiliation, Afraid, Rape, and Kick questionnaire  Fear of Current or Ex-Partner: Patient refused    Emotionally Abused: Patient refused    Physically Abused: Patient refused    Sexually Abused: Patient refused    Review of Systems  All other systems reviewed and are negative.       Objective    BP 114/81   Pulse 87   Temp 98.1 F (36.7 C) (Oral)   Resp 16   Ht 5\' 1"  (1.549 m)   Wt 157 lb 9.6 oz (71.5 kg)   SpO2 98%   BMI 29.78 kg/m   Physical Exam Vitals and nursing note reviewed.  Constitutional:      General: She is not in acute distress. Cardiovascular:     Rate and Rhythm: Normal rate and regular rhythm.  Pulmonary:     Effort:  Pulmonary effort is normal.     Breath sounds: Normal breath sounds.  Abdominal:     Palpations: Abdomen is soft.     Tenderness: There is no abdominal tenderness.  Musculoskeletal:     Right lower leg: No edema.     Left lower leg: No edema.  Neurological:     General: No focal deficit present.     Mental Status: She is alert and oriented to person, place, and time.         Assessment & Plan:   1. Essential hypertension Much improved and appears stable with present management. Continue and monitor meds refilled.  - CMP14+EGFR - Lipid Panel  2. Language barrier to communication     Return in about 6 months (around 08/09/2022) for follow up.   Tommie Raymond, MD

## 2022-02-17 ENCOUNTER — Encounter: Payer: Self-pay | Admitting: Family

## 2022-02-17 ENCOUNTER — Ambulatory Visit: Payer: Medicaid Other | Admitting: Family

## 2022-02-17 VITALS — BP 106/73 | HR 105 | Temp 97.9°F | Resp 16 | Ht 60.98 in | Wt 151.0 lb

## 2022-02-17 DIAGNOSIS — Z789 Other specified health status: Secondary | ICD-10-CM | POA: Diagnosis not present

## 2022-02-17 DIAGNOSIS — B950 Streptococcus, group A, as the cause of diseases classified elsewhere: Secondary | ICD-10-CM

## 2022-02-17 DIAGNOSIS — J029 Acute pharyngitis, unspecified: Secondary | ICD-10-CM

## 2022-02-17 LAB — POCT RAPID STREP A (OFFICE): Rapid Strep A Screen: POSITIVE — AB

## 2022-02-17 LAB — POCT URINALYSIS DIP (CLINITEK)
Glucose, UA: NEGATIVE mg/dL
Leukocytes, UA: NEGATIVE
Nitrite, UA: NEGATIVE
POC PROTEIN,UA: >=300 — AB
Spec Grav, UA: >=1.03 — AB (ref 1.010–1.025)
Urobilinogen, UA: 0.2 E.U./dL
pH, UA: 5.5 (ref 5.0–8.0)

## 2022-02-17 MED ORDER — AMOXICILLIN 500 MG PO CAPS: 500.0000 mg | ORAL_CAPSULE | Freq: Two times a day (BID) | ORAL | 0 refills | Status: AC

## 2022-02-17 MED ORDER — IBUPROFEN 600 MG PO TABS: 600.0000 mg | ORAL_TABLET | Freq: Three times a day (TID) | ORAL | 1 refills | Status: DC | PRN

## 2022-02-17 NOTE — Progress Notes (Signed)
Patient ID: Haley Griffin, female    DOB: [birth year]  MRN: [id number]  CC: Sore Throat   Subjective: Haley Griffin is a 36 y.o. female who presents for sore throat. She is accompanied by her husband.   Her concerns today include:  Reports fever began last night. Endorses headache. Taking over-the-counter Tylenol. No additional symptoms. Denies red flag symptoms such as but not limited to chest pain, shortness of breath, nausea, and vomiting.   Patient Active Problem List   Diagnosis Date Noted   Chronic hypertension 10/13/2018   VBAC, delivered 08/19/2018   Supervision of high risk pregnancy, antepartum 08/02/2018   Language barrier to communication 08/02/2018   History of cesarean delivery 01/22/2017   History of preterm delivery 12/16/2016   History of gestational diabetes 11/19/2016     Current Outpatient Medications on File Prior to Visit  Medication Sig Dispense Refill   losartan (COZAAR) 50 MG tablet Take 1 tablet (50 mg total) by mouth daily. 90 tablet 1   No current facility-administered medications on file prior to visit.    No Known Allergies  Social History   Socioeconomic History   Marital status: Married    Spouse name: Not on file   Number of children: Not on file   Years of education: Not on file   Highest education level: Not on file  Occupational History   Not on file  Tobacco Use   Smoking status: Never    Passive exposure: Never   Smokeless tobacco: Never  Vaping Use   Vaping Use: Never used  Substance and Sexual Activity   Alcohol use: No   Drug use: No   Sexual activity: Yes    Birth control/protection: Implant  Other Topics Concern   Not on file  Social History Narrative   Not on file   Social Determinants of Health   Financial Resource Strain: Low Risk  (08/18/2018)   Overall Financial Resource Strain (CARDIA)    Difficulty of Paying Living Expenses: Not hard at all  Food Insecurity: Food Insecurity Present (04/09/2020)   Hunger Vital Sign     Worried About Running Out of Food in the Last Year: Sometimes true    Ran Out of Food in the Last Year: Sometimes true  Transportation Needs: No Transportation Needs (04/09/2020)   PRAPARE - Administrator, Civil Service (Medical): No    Lack of Transportation (Non-Medical): No  Physical Activity: Unknown (08/18/2018)   Exercise Vital Sign    Days of Exercise per Week: Patient refused    Minutes of Exercise per Session: Patient refused  Stress: No Stress Concern Present (08/18/2018)   [employer] of Occupational Health - Occupational Stress Questionnaire    Feeling of Stress : Not at all  Social Connections: Unknown (08/18/2018)   Social Connection and Isolation Panel [NHANES]    Frequency of Communication with Friends and Family: Patient refused    Frequency of Social Gatherings with Friends and Family: Patient refused    Attends Religious Services: Patient refused    Active Member of Clubs or Organizations: Patient refused    Attends Banker Meetings: Patient refused    Marital Status: Patient refused  Intimate Partner Violence: Unknown (08/18/2018)   Humiliation, Afraid, Rape, and Kick questionnaire    Fear of Current or Ex-Partner: Patient refused    Emotionally Abused: Patient refused    Physically Abused: Patient refused    Sexually Abused: Patient refused    Family History  Problem  Relation Age of Onset   Diabetes Mother     Past Surgical History:  Procedure Laterality Date   CESAREAN SECTION     CESAREAN SECTION MULTI-GESTATIONAL N/A 12/19/2016   Procedure: CESAREAN SECTION MULTI-GESTATIONAL;  Surgeon:  Bing, MD;  Location: [facility];  Service: Obstetrics;  Laterality: N/A;    ROS: Review of Systems Negative except as stated above  PHYSICAL EXAM: BP 106/73 (BP Location: Left Arm, Patient Position: Sitting, Cuff Size: Large)   Pulse (!) 105   Temp 97.9 F (36.6 C)   Resp 16   Ht 5' 0.98" (1.549 m)   Wt 151 lb (68.5 kg)    SpO2 97%   BMI 28.55 kg/m   Physical Exam HENT:     Head: Normocephalic and atraumatic.     Right Ear: Tympanic membrane and ear canal normal.     Left Ear: Tympanic membrane and ear canal normal.     Mouth/Throat:     Mouth: Mucous membranes are moist.     Pharynx: Posterior oropharyngeal erythema present.  Eyes:     Extraocular Movements: Extraocular movements intact.     Conjunctiva/sclera: Conjunctivae normal.     Pupils: Pupils are equal, round, and reactive to light.  Cardiovascular:     Rate and Rhythm: Tachycardia present.     Heart sounds: Normal heart sounds.  Pulmonary:     Effort: Pulmonary effort is normal.     Breath sounds: Normal breath sounds.  Musculoskeletal:     Cervical back: Normal range of motion and neck supple.  Neurological:     General: No focal deficit present.     Mental Status: She is alert and oriented to person, place, and time.  Psychiatric:        Mood and Affect: Mood normal.        Behavior: Behavior normal.    Results for orders placed or performed in visit on 02/17/22  POCT rapid strep A  Result Value Ref Range   Rapid Strep A Screen Positive (A) Negative  POCT URINALYSIS DIP (CLINITEK)  Result Value Ref Range   Color, UA red (A) yellow   Clarity, UA cloudy (A) clear   Glucose, UA negative negative mg/dL   Bilirubin, UA small (A) negative   Ketones, POC UA small (15) (A) negative mg/dL   Spec Grav, UA >=3.474 (A) 1.010 - 1.025   Blood, UA large (A) negative   pH, UA 5.5 5.0 - 8.0   POC PROTEIN,UA >=300 (A) negative, trace   Urobilinogen, UA 0.2 0.2 or 1.0 E.U./dL   Nitrite, UA Negative Negative   Leukocytes, UA Negative Negative     ASSESSMENT AND PLAN: 1. Streptococcal infection group A 2. Sore throat - Rapid strep A positive and discussed in office with patient.  - No urinary tract infection. Urine culture for further evaluation.  - BMP to evaluate kidney function and electrolyte balance. - CBC to screen for  anemia. - Ibuprofen as prescribed. Counseled on medication adherence and adverse effects.  - Amoxicillin as prescribed. Counseled on medication adherence and adverse effects. - POCT rapid strep A - Basic Metabolic Panel - CBC - POCT URINALYSIS DIP (CLINITEK) - ibuprofen (ADVIL) 600 MG tablet; Take 1 tablet (600 mg total) by mouth every 8 (eight) hours as needed.  Dispense: 30 tablet; Refill: 1 - amoxicillin (AMOXIL) 500 MG capsule; Take 1 capsule (500 mg total) by mouth 2 (two) times daily for 10 days.  Dispense: 20 capsule; Refill: 0 - Urine  Culture  3. Language barrier - [place] in-person interpreter, Y Hin.   Patient was given the opportunity to ask questions.  Patient verbalized understanding of the plan and was able to repeat key elements of the plan. Patient was given clear instructions to go to Emergency Department or return to medical center if symptoms don't improve, worsen, or new problems develop.The patient verbalized understanding.   Orders Placed This Encounter  Procedures   Urine Culture   Basic Metabolic Panel   CBC   POCT rapid strep A   POCT URINALYSIS DIP (CLINITEK)     Requested Prescriptions   Signed Prescriptions Disp Refills   ibuprofen (ADVIL) 600 MG tablet 30 tablet 1    Sig: Take 1 tablet (600 mg total) by mouth every 8 (eight) hours as needed.   amoxicillin (AMOXIL) 500 MG capsule 20 capsule 0    Sig: Take 1 capsule (500 mg total) by mouth 2 (two) times daily for 10 days.    Return in about 1 week (around 02/24/2022) for Follow-Up or next available Georganna Skeans, MD.  Rema Fendt, NP

## 2022-02-17 NOTE — Progress Notes (Signed)
  Acute Office Visit  Subjective:     Patient ID: Haley Griffin, female    DOB: 05-31-1986, 36 y.o.   MRN: [id number]  Chief Complaint  Patient presents with   Sore Throat    HPI Patient is in today for sore throat started last night, symptoms include dizziness white patches present at back of throat   ROS      Objective:    BP 106/73 (BP Location: Left Arm, Patient Position: Sitting, Cuff Size: Large)   Pulse (!) 105   Temp 97.9 F (36.6 C)   Resp 16   Ht 5' 0.98" (1.549 m)   Wt 151 lb (68.5 kg)   SpO2 97%   BMI 28.55 kg/m    Physical Exam  No results found for any visits on 02/17/22.      Assessment & Plan:   Problem List Items Addressed This Visit   None   No orders of the defined types were placed in this encounter.   No follow-ups on file.  Margorie John, CMA

## 2022-02-17 NOTE — Progress Notes (Deleted)
Acute Office Visit  Subjective:     Patient ID: Haley Griffin, female    DOB: 01-04-1986, 36 y.o.   MRN: [id number]  Chief Complaint  Patient presents with   Sore Throat    HPI Patient is in today for sore throat started last night, symptoms include dizziness white patches present at back of throat  ROS      Objective:    Resp 16   Ht 5' 0.98" (1.549 m)   BMI 29.79 kg/m    Physical Exam  No results found for any visits on 02/17/22.      Assessment & Plan:   Problem List Items Addressed This Visit   None   No orders of the defined types were placed in this encounter.   No follow-ups on file.  Margorie John, CMA

## 2022-02-17 NOTE — Patient Instructions (Signed)
Rapid Strep Test Why am I having this test? A rapid strep test is used to check for strep throat. Strep throat is a bacterial infection caused by the bacteria Streptococcus pyogenes. A rapid strep test is the quickest way to check if these bacteria are causing your sore throat. You may have this test if: You have throat pain or neck swelling and tenderness. You have a fever. You have a red throat with yellow or white spots. You experience loss of appetite. You have trouble breathing or painful swallowing. You have a rash. You are dehydrated. The test can be done at your health care provider's office. Results are usually ready in about 20 minutes. What is being tested? This test checks for the presence of the Streptococcus pyogenes bacteria. What kind of sample is taken?  This test requires a sample of fluid from the back of your throat and tonsils. Your health care provider may hold down your tongue with a tongue depressor and use a swab to collect the sample. Your health care provider may collect a second sample at the same time. The second sample may be used for a throat culture. In a culture test, the sample is combined with a substance that encourages bacteria to grow. It takes longer to get the results of the throat culture test, but they are more accurate. A culture test can confirm the results from a rapid strep test, or it may show that the results were wrong. How are the results reported? Your test results will be reported as either positive or negative for the bacteria that cause strep throat. What do the results mean? Talk with your health care provider about what your results mean. In some cases, your health care provider may do more testing to confirm the results. If the result of your rapid strep test is negative, it means that: It is likely that you do not have strep throat. A virus may be causing your sore throat. If the result of your rapid strep test is positive, it means  that: It is likely that you do have strep throat. You may have to take antibiotic medicine. Talk with your health care provider about what your results mean. Your health care provider may do a throat culture to confirm the results of the rapid strep test. The throat culture can also identify the different strains of bacteria that are present. Questions to ask your health care provider Ask your health care provider, or the department that is doing the test: When will my results be ready? How will I get my results? What are my treatment options? What other tests do I need? What are my next steps? Summary A rapid strep test is used to check for strep throat. Strep throat is a bacterial infection caused by the bacteria Streptococcus pyogenes. A rapid strep test is the quickest way to check if these bacteria are causing your sore throat. The test can be done at your health care provider's office. Results are usually ready in about 20 minutes. This test requires a sample of fluid from the back of your throat and tonsils. Your health care provider may hold down your tongue with a tongue depressor and use a swab to collect the sample. Your test results will be reported as either positive or negative for the bacteria that cause strep throat. This information is not intended to replace advice given to you by your health care provider. Make sure you discuss any questions you have with your health  care provider. Document Revised: 11/27/2020 Document Reviewed: 11/27/2020 Elsevier Patient Education  2023 [employer].

## 2022-02-18 LAB — CBC
Hematocrit: 39.1 % (ref 34.0–46.6)
Hemoglobin: 12.1 g/dL (ref 11.1–15.9)
MCH: 22 pg — ABNORMAL LOW (ref 26.6–33.0)
MCHC: 30.9 g/dL — ABNORMAL LOW (ref 31.5–35.7)
MCV: 71 fL — ABNORMAL LOW (ref 79–97)
Platelets: 269 10*3/uL (ref 150–450)
RBC: 5.5 x10E6/uL — ABNORMAL HIGH (ref 3.77–5.28)
RDW: 14.2 % (ref 11.7–15.4)
WBC: 15.8 10*3/uL — ABNORMAL HIGH (ref 3.4–10.8)

## 2022-02-18 LAB — BASIC METABOLIC PANEL
BUN/Creatinine Ratio: 11 (ref 9–23)
BUN: 8 mg/dL (ref 6–20)
CO2: 18 mmol/L — ABNORMAL LOW (ref 20–29)
Calcium: 8.8 mg/dL (ref 8.7–10.2)
Chloride: 100 mmol/L (ref 96–106)
Creatinine, Ser: 0.74 mg/dL (ref 0.57–1.00)
Sodium: 138 mmol/L (ref 134–144)
eGFR: 107 mL/min/{1.73_m2} (ref >59)

## 2022-02-20 ENCOUNTER — Other Ambulatory Visit: Payer: Self-pay | Admitting: Family

## 2022-02-20 DIAGNOSIS — N3001 Acute cystitis with hematuria: Secondary | ICD-10-CM

## 2022-02-20 LAB — URINE CULTURE

## 2022-02-20 MED ORDER — AMPICILLIN 500 MG PO CAPS: 500.0000 mg | ORAL_CAPSULE | Freq: Four times a day (QID) | ORAL | 0 refills | Status: AC

## 2022-02-26 ENCOUNTER — Encounter: Payer: Self-pay | Admitting: Family Medicine

## 2022-02-26 ENCOUNTER — Ambulatory Visit: Payer: Medicaid Other | Admitting: Family Medicine

## 2022-02-26 VITALS — BP 125/88 | HR 87 | Temp 98.1°F | Resp 16 | Wt 154.6 lb

## 2022-02-26 DIAGNOSIS — I10 Essential (primary) hypertension: Secondary | ICD-10-CM

## 2022-02-26 DIAGNOSIS — Z789 Other specified health status: Secondary | ICD-10-CM | POA: Diagnosis not present

## 2022-02-26 DIAGNOSIS — R319 Hematuria, unspecified: Secondary | ICD-10-CM

## 2022-02-26 NOTE — Progress Notes (Signed)
Patient is here for f/u HTN. Patient has no other concerns today

## 2022-02-27 ENCOUNTER — Encounter: Payer: Self-pay | Admitting: Family Medicine

## 2022-02-27 LAB — POCT URINALYSIS DIP (CLINITEK)
Bilirubin, UA: NEGATIVE
Glucose, UA: NEGATIVE mg/dL
Ketones, POC UA: NEGATIVE mg/dL
Nitrite, UA: NEGATIVE
POC PROTEIN,UA: NEGATIVE
Spec Grav, UA: >=1.03 — AB (ref 1.010–1.025)
Urobilinogen, UA: 0.2 E.U./dL
pH, UA: 6 (ref 5.0–8.0)

## 2022-02-27 MED ORDER — NITROFURANTOIN MONOHYD MACRO 100 MG PO CAPS: 100.0000 mg | ORAL_CAPSULE | Freq: Two times a day (BID) | ORAL | 0 refills | Status: DC

## 2022-02-27 NOTE — Progress Notes (Signed)
Established Patient Office Visit  Subjective    Patient ID: Haley Griffin, female    DOB: 02-25-1986  Age: 36 y.o. MRN: [id number]  CC:  Chief Complaint  Patient presents with   Follow-up    HPI Haley Griffin presents for complaint of recent visit with dx of UTI. She reports symptoms have improved and she has 1-2 days of abx remaining. This office visit was aided by an interpreter.    Outpatient Encounter Medications as of 02/26/2022  Medication Sig   amoxicillin (AMOXIL) 500 MG capsule Take 1 capsule (500 mg total) by mouth 2 (two) times daily for 10 days.   ibuprofen (ADVIL) 600 MG tablet Take 1 tablet (600 mg total) by mouth every 8 (eight) hours as needed.   losartan (COZAAR) 50 MG tablet Take 1 tablet (50 mg total) by mouth daily.   No facility-administered encounter medications on file as of 02/26/2022.    Past Medical History:  Diagnosis Date   Gestational diabetes    History of gestational diabetes 11/19/2016   Borderline results on early 3hr OGTT> Saw dietician and checking CBGs   History of preterm delivery 12/16/2016   Hypertension     Past Surgical History:  Procedure Laterality Date   CESAREAN SECTION     CESAREAN SECTION MULTI-GESTATIONAL N/A 12/19/2016   Procedure: CESAREAN SECTION MULTI-GESTATIONAL;  Surgeon: [place] Bing, MD;  Location: [facility];  Service: Obstetrics;  Laterality: N/A;    Family History  Problem Relation Age of Onset   Diabetes Mother     Social History   Socioeconomic History   Marital status: Married    Spouse name: Not on file   Number of children: Not on file   Years of education: Not on file   Highest education level: Not on file  Occupational History   Not on file  Tobacco Use   Smoking status: Never    Passive exposure: Never   Smokeless tobacco: Never  Vaping Use   Vaping Use: Never used  Substance and Sexual Activity   Alcohol use: No   Drug use: No   Sexual activity: Yes    Birth control/protection: Implant   Other Topics Concern   Not on file  Social History Narrative   Not on file   Social Determinants of Health   Financial Resource Strain: Low Risk  (08/18/2018)   Overall Financial Resource Strain (CARDIA)    Difficulty of Paying Living Expenses: Not hard at all  Food Insecurity: Food Insecurity Present (04/09/2020)   Hunger Vital Sign    Worried About Running Out of Food in the Last Year: Sometimes true    Ran Out of Food in the Last Year: Sometimes true  Transportation Needs: No Transportation Needs (04/09/2020)   PRAPARE - Administrator, Civil Service (Medical): No    Lack of Transportation (Non-Medical): No  Physical Activity: Unknown (08/18/2018)   Exercise Vital Sign    Days of Exercise per Week: Patient refused    Minutes of Exercise per Session: Patient refused  Stress: No Stress Concern Present (08/18/2018)   [employer] of Occupational Health - Occupational Stress Questionnaire    Feeling of Stress : Not at all  Social Connections: Unknown (08/18/2018)   Social Connection and Isolation Panel [NHANES]    Frequency of Communication with Friends and Family: Patient refused    Frequency of Social Gatherings with Friends and Family: Patient refused    Attends Religious Services: Patient refused    Active  Member of Clubs or Organizations: Patient refused    Attends Banker Meetings: Patient refused    Marital Status: Patient refused  Intimate Partner Violence: Unknown (08/18/2018)   Humiliation, Afraid, Rape, and Kick questionnaire    Fear of Current or Ex-Partner: Patient refused    Emotionally Abused: Patient refused    Physically Abused: Patient refused    Sexually Abused: Patient refused    Review of Systems  Genitourinary:  Positive for dysuria. Negative for hematuria.  All other systems reviewed and are negative.       Objective    BP 125/88   Pulse 87   Temp 98.1 F (36.7 C) (Oral)   Resp 16   Wt 154 lb 9.6 oz (70.1 kg)   SpO2 97%    BMI 29.23 kg/m   Physical Exam Vitals and nursing note reviewed.  Constitutional:      General: She is not in acute distress. Cardiovascular:     Rate and Rhythm: Normal rate and regular rhythm.  Pulmonary:     Effort: Pulmonary effort is normal.     Breath sounds: Normal breath sounds.  Abdominal:     Palpations: Abdomen is soft.     Tenderness: There is no abdominal tenderness.  Neurological:     General: No focal deficit present.     Mental Status: She is alert and oriented to person, place, and time.         Assessment & Plan:   1. Hematuria, unspecified type Hematuria persists. Macrobid prescribed.  - POCT URINALYSIS DIP (CLINITEK)  2. Essential hypertension Appears stable. continue  3. Language barrier to communication     No follow-ups on file.   Tommie Raymond, MD

## 2022-08-04 ENCOUNTER — Ambulatory Visit: Payer: Medicaid Other | Admitting: Family Medicine

## 2022-09-05 ENCOUNTER — Encounter: Payer: Self-pay | Admitting: Family Medicine

## 2022-09-05 ENCOUNTER — Ambulatory Visit: Payer: Medicaid Other | Admitting: Family Medicine

## 2022-09-05 VITALS — BP 122/86 | HR 88 | Temp 98.1°F | Resp 16 | Wt 153.4 lb

## 2022-09-05 DIAGNOSIS — Z789 Other specified health status: Secondary | ICD-10-CM | POA: Diagnosis not present

## 2022-09-05 DIAGNOSIS — J029 Acute pharyngitis, unspecified: Secondary | ICD-10-CM | POA: Diagnosis not present

## 2022-09-05 DIAGNOSIS — I10 Essential (primary) hypertension: Secondary | ICD-10-CM

## 2022-09-05 DIAGNOSIS — B950 Streptococcus, group A, as the cause of diseases classified elsewhere: Secondary | ICD-10-CM

## 2022-09-05 MED ORDER — LOSARTAN POTASSIUM 50 MG PO TABS: 50.0000 mg | ORAL_TABLET | Freq: Every day | ORAL | 1 refills | Status: AC

## 2022-09-05 MED ORDER — IBUPROFEN 600 MG PO TABS
600.0000 mg | ORAL_TABLET | Freq: Three times a day (TID) | ORAL | 1 refills | Status: DC | PRN
Start: 2022-09-05 — End: 2023-09-09

## 2022-09-05 NOTE — Progress Notes (Signed)
Patient is here for their 6 month follow-up Patient has no concerns today Care gaps have been discussed with patient   Due to language barrier, an interpreter was used.  Interpreter name or [employer] Interpreter ID #--[id number], Reason for encounter--OV.  Rodney Cruise, RMA

## 2022-09-08 ENCOUNTER — Encounter: Payer: Self-pay | Admitting: Family Medicine

## 2022-09-08 NOTE — Progress Notes (Signed)
Established Patient Office Visit  Subjective    Patient ID: Haley Griffin, female    DOB: October 13, 1985  Age: 37 y.o. MRN: [id number]  CC:  Chief Complaint  Patient presents with   Follow-up    HPI Haley Griffin presents for follow up of hypertension. This visit was aided by an interpreter.    Outpatient Encounter Medications as of 09/05/2022  Medication Sig   [DISCONTINUED] ibuprofen (ADVIL) 600 MG tablet Take 1 tablet (600 mg total) by mouth every 8 (eight) hours as needed.   [DISCONTINUED] losartan (COZAAR) 50 MG tablet Take 1 tablet (50 mg total) by mouth daily.   ibuprofen (ADVIL) 600 MG tablet Take 1 tablet (600 mg total) by mouth every 8 (eight) hours as needed.   losartan (COZAAR) 50 MG tablet Take 1 tablet (50 mg total) by mouth daily.   [DISCONTINUED] nitrofurantoin, macrocrystal-monohydrate, (MACROBID) 100 MG capsule Take 1 capsule (100 mg total) by mouth 2 (two) times daily. (Patient not taking: Reported on 09/05/2022)   No facility-administered encounter medications on file as of 09/05/2022.    Past Medical History:  Diagnosis Date   Gestational diabetes    History of gestational diabetes 11/19/2016   Borderline results on early 3hr OGTT> Saw dietician and checking CBGs   History of preterm delivery 12/16/2016   Hypertension     Past Surgical History:  Procedure Laterality Date   CESAREAN SECTION     CESAREAN SECTION MULTI-GESTATIONAL N/A 12/19/2016   Procedure: CESAREAN SECTION MULTI-GESTATIONAL;  Surgeon: Haley Halim, MD;  Location: [facility];  Service: Obstetrics;  Laterality: N/A;    Family History  Problem Relation Age of Onset   Diabetes Mother     Social History   Socioeconomic History   Marital status: Married    Spouse name: Not on file   Number of children: Not on file   Years of education: Not on file   Highest education level: Not on file  Occupational History   Not on file  Tobacco Use   Smoking status: Never    Passive exposure: Never    Smokeless tobacco: Never  Vaping Use   Vaping Use: Never used  Substance and Sexual Activity   Alcohol use: No   Drug use: No   Sexual activity: Yes    Birth control/protection: Implant  Other Topics Concern   Not on file  Social History Narrative   Not on file   Social Determinants of Health   Financial Resource Strain: Low Risk  (08/18/2018)   Overall Financial Resource Strain (CARDIA)    Difficulty of Paying Living Expenses: Not hard at all  Food Insecurity: Food Insecurity Present (04/09/2020)   Hunger Vital Sign    Worried About [place] in the Last Year: Sometimes true    Ran Out of Food in the Last Year: Sometimes true  Transportation Needs: No Transportation Needs (04/09/2020)   PRAPARE - Hydrologist (Medical): No    Lack of Transportation (Non-Medical): No  Physical Activity: Unknown (08/18/2018)   Exercise Vital Sign    Days of Exercise per Week: Patient refused    Minutes of Exercise per Session: Patient refused  Stress: No Stress Concern Present (08/18/2018)   [place]    Feeling of Stress : Not at all  Social Connections: Unknown (08/18/2018)   Social Connection and Isolation Panel [NHANES]    Frequency of Communication with Friends and Family: Patient  refused    Frequency of Social Gatherings with Friends and Family: Patient refused    Attends Religious Services: Patient refused    Active Member of Clubs or Organizations: Patient refused    Attends Archivist Meetings: Patient refused    Marital Status: Patient refused  Intimate Partner Violence: Unknown (08/18/2018)   Humiliation, Afraid, Rape, and Kick questionnaire    Fear of Current or Ex-Partner: Patient refused    Emotionally Abused: Patient refused    Physically Abused: Patient refused    Sexually Abused: Patient refused    Review of Systems  All other systems reviewed and are negative.        Objective    BP 122/86   Pulse 88   Temp 98.1 F (36.7 C) (Oral)   Resp 16   Wt 153 lb 6.4 oz (69.6 kg)   SpO2 98%   BMI 29.00 kg/m   Physical Exam Vitals and nursing note reviewed.  Constitutional:      General: She is not in acute distress. HENT:     Mouth/Throat:     Mouth: Mucous membranes are moist.     Pharynx: Oropharynx is clear. No posterior oropharyngeal erythema.  Cardiovascular:     Rate and Rhythm: Normal rate and regular rhythm.  Pulmonary:     Effort: Pulmonary effort is normal.     Breath sounds: Normal breath sounds.  Abdominal:     Palpations: Abdomen is soft.     Tenderness: There is no abdominal tenderness.  Neurological:     General: No focal deficit present.     Mental Status: She is alert and oriented to person, place, and time.         Assessment & Plan:   1. Essential hypertension Doing well with present management. continue  2. Language barrier to communication     Return in about 6 months (around 03/06/2023) for physical.   Haley Sax, MD

## 2023-02-16 ENCOUNTER — Ambulatory Visit: Payer: Self-pay

## 2023-02-16 ENCOUNTER — Ambulatory Visit: Payer: Medicaid Other | Admitting: Physician Assistant

## 2023-02-16 VITALS — BP 127/87 | HR 80 | Ht 64.0 in | Wt 143.0 lb

## 2023-02-16 DIAGNOSIS — L2489 Irritant contact dermatitis due to other agents: Secondary | ICD-10-CM | POA: Diagnosis not present

## 2023-02-16 MED ORDER — EUCERIN EX LOTN: TOPICAL_LOTION | CUTANEOUS | 0 refills | Status: AC | PRN

## 2023-02-16 MED ORDER — CLOBETASOL PROPIONATE 0.05 % EX OINT
1.0000 | TOPICAL_OINTMENT | Freq: Two times a day (BID) | CUTANEOUS | 0 refills | Status: DC
Start: 2023-02-16 — End: 2023-03-17

## 2023-02-16 NOTE — Telephone Encounter (Signed)
    Chief Complaint: Red , peeling rash to hand Symptoms: Itching Frequency: 1 month ago Pertinent Negatives: Patient denies any other symptoms Disposition: [] ED /[] Urgent Care (no appt availability in office) / [] Appointment(In office/virtual)/ []  [facility]/ [] Home Care/ [] Refused Recommended Disposition /[x] [facility]/ []  Follow-up with PCP Additional Notes:   Reason for Disposition  [1] Severe localized itching AND [2] after 2 days of steroid cream  Answer Assessment - Initial Assessment Questions 1. APPEARANCE of RASH: "Describe the rash."      Red, peeling 2. LOCATION: "Where is the rash located?"      Hand 3. NUMBER: "How many spots are there?"      1 4. SIZE: "How big are the spots?" (Inches, centimeters or compare to size of a coin)      Thumb 5. ONSET: "When did the rash start?"      1 month ago 6. ITCHING: "Does the rash itch?" If Yes, ask: "How bad is the itch?"  (Scale 0-10; or none, mild, moderate, severe)     Severe 7. PAIN: "Does the rash hurt?" If Yes, ask: "How bad is the pain?"  (Scale 0-10; or none, mild, moderate, severe)    - NONE (0): no pain    - MILD (1-3): doesn't interfere with normal activities     - MODERATE (4-7): interferes with normal activities or awakens from sleep     - SEVERE (8-10): excruciating pain, unable to do any normal activities     None 8. OTHER SYMPTOMS: "Do you have any other symptoms?" (e.g., fever)     No 9. PREGNANCY: "Is there any chance you are pregnant?" "When was your last menstrual period?"     No  Protocols used: Rash or Redness - Localized-A-AH

## 2023-02-17 ENCOUNTER — Encounter: Payer: Self-pay | Admitting: Physician Assistant

## 2023-03-06 ENCOUNTER — Encounter: Payer: Self-pay | Admitting: Family Medicine

## 2023-03-06 ENCOUNTER — Ambulatory Visit: Payer: Medicaid Other | Admitting: Family Medicine

## 2023-03-06 VITALS — BP 134/93 | HR 101 | Temp 98.1°F | Resp 16 | Ht 60.0 in | Wt 149.8 lb

## 2023-03-06 DIAGNOSIS — R0981 Nasal congestion: Secondary | ICD-10-CM

## 2023-03-06 DIAGNOSIS — Z789 Other specified health status: Secondary | ICD-10-CM | POA: Diagnosis not present

## 2023-03-06 DIAGNOSIS — I10 Essential (primary) hypertension: Secondary | ICD-10-CM

## 2023-03-06 MED ORDER — LOSARTAN POTASSIUM 50 MG PO TABS: 50.0000 mg | ORAL_TABLET | Freq: Every day | ORAL | 1 refills | Status: DC

## 2023-03-07 LAB — COVID-19, FLU A+B AND RSV
Influenza A, NAA: NOT DETECTED
Influenza B, NAA: NOT DETECTED
RSV, NAA: NOT DETECTED
SARS-CoV-2, NAA: DETECTED — AB

## 2023-03-09 NOTE — Progress Notes (Signed)
Established Patient Office Visit  Subjective    Patient ID: Haley Griffin, female    DOB: Jan 01, 1986  Age: 37 y.o. MRN: [id number]  CC:  Chief Complaint  Patient presents with   Nasal Congestion    HPI Haley Griffin presents for follow up of hypertension. Patient also reports that she has been having sever congestion and some malaise for a couple of days. She works at a [employer]. This visit was aided by an interpreter.    Outpatient Encounter Medications as of 03/06/2023  Medication Sig   clobetasol ointment (TEMOVATE) 0.05 % Apply 1 Application topically 2 (two) times daily.   Emollient (EUCERIN) lotion Apply topically as needed for dry skin.   ibuprofen (ADVIL) 600 MG tablet Take 1 tablet (600 mg total) by mouth every 8 (eight) hours as needed.   losartan (COZAAR) 50 MG tablet Take 1 tablet (50 mg total) by mouth daily.   [DISCONTINUED] losartan (COZAAR) 50 MG tablet Take 50 mg by mouth daily.   losartan (COZAAR) 50 MG tablet Take 1 tablet (50 mg total) by mouth daily.   No facility-administered encounter medications on file as of 03/06/2023.    Past Medical History:  Diagnosis Date   Gestational diabetes    History of gestational diabetes 11/19/2016   Borderline results on early 3hr OGTT> Saw dietician and checking CBGs   History of preterm delivery 12/16/2016   Hypertension     Past Surgical History:  Procedure Laterality Date   CESAREAN SECTION     CESAREAN SECTION MULTI-GESTATIONAL N/A 12/19/2016   Procedure: CESAREAN SECTION MULTI-GESTATIONAL;  Surgeon: [place] Bing, MD;  [facility]: [facility];  Service: Obstetrics;  Laterality: N/A;    Family History  Problem Relation Age of Onset   Diabetes Mother     Social History   Socioeconomic History   Marital status: Married    Spouse name: Not on file   Number of children: Not on file   Years of education: Not on file   Highest education level: Not on file  Occupational History   Not on file  Tobacco Use    Smoking status: Never    Passive exposure: Never   Smokeless tobacco: Never  Vaping Use   Vaping status: Never Used  Substance and Sexual Activity   Alcohol use: No   Drug use: No   Sexual activity: Yes    Birth control/protection: Implant  Other Topics Concern   Not on file  Social History Narrative   Not on file   Social Determinants of Health   Financial Resource Strain: Low Risk  (08/18/2018)   Overall Financial Resource Strain (CARDIA)    Difficulty of Paying Living Expenses: Not hard at all  Food Insecurity: Food Insecurity Present (04/09/2020)   Hunger Vital Sign    Worried About Running Out of Food in the Last Year: Sometimes true    Ran Out of Food in the Last Year: Sometimes true  Transportation Needs: No Transportation Needs (04/09/2020)   PRAPARE - Administrator, Civil Service (Medical): No    Lack of Transportation (Non-Medical): No  Physical Activity: Unknown (08/18/2018)   Exercise Vital Sign    Days of Exercise per Week: Patient declined    Minutes of Exercise per Session: Patient declined  Stress: No Stress Concern Present (08/18/2018)   [employer] of Occupational Health - Occupational Stress Questionnaire    Feeling of Stress : Not at all  Social Connections: Unknown (08/18/2018)  Social Advertising account executive [NHANES]    Frequency of Communication with Friends and Family: Patient declined    Frequency of Social Gatherings with Friends and Family: Patient declined    Attends Religious Services: Patient declined    Database administrator or Organizations: Patient declined    Attends Banker Meetings: Patient declined    Marital Status: Patient declined  Intimate Partner Violence: Unknown (08/18/2018)   Humiliation, Afraid, Rape, and Kick questionnaire    Fear of Current or Ex-Partner: Patient declined    Emotionally Abused: Patient declined    Physically Abused: Patient declined    Sexually Abused: Patient declined     Review of Systems  HENT:  Positive for congestion and sore throat.   All other systems reviewed and are negative.       Objective    BP (!) 134/93   Pulse (!) 101   Temp 98.1 F (36.7 C) (Oral)   Resp 16   Ht 5' (1.524 m)   Wt 149 lb 12.8 oz (67.9 kg)   SpO2 97%   BMI 29.26 kg/m   Physical Exam Vitals and nursing note reviewed.  Constitutional:      General: She is not in acute distress. HENT:     Head: Normocephalic and atraumatic.     Nose: Congestion present.     Mouth/Throat:     Mouth: Mucous membranes are moist.     Pharynx: Oropharynx is clear. No posterior oropharyngeal erythema.  Cardiovascular:     Rate and Rhythm: Normal rate and regular rhythm.  Pulmonary:     Effort: Pulmonary effort is normal.     Breath sounds: Normal breath sounds.  Abdominal:     Palpations: Abdomen is soft.     Tenderness: There is no abdominal tenderness.  Neurological:     General: No focal deficit present.     Mental Status: She is alert and oriented to person, place, and time.         Assessment & Plan:   1. Congested nose Tests results pending. - COVID-19, Flu A+B and RSV  2. Essential hypertension Discussed compliance. Meds refilled.   3. Language barrier to communication     No follow-ups on file.   Tommie Raymond, MD

## 2023-03-16 ENCOUNTER — Other Ambulatory Visit: Payer: Self-pay | Admitting: Physician Assistant

## 2023-03-16 DIAGNOSIS — L2489 Irritant contact dermatitis due to other agents: Secondary | ICD-10-CM

## 2023-09-09 ENCOUNTER — Ambulatory Visit: Payer: Medicaid Other | Admitting: Family Medicine

## 2023-09-09 ENCOUNTER — Encounter: Payer: Self-pay | Admitting: Family Medicine

## 2023-09-09 VITALS — BP 141/101 | HR 97 | Temp 98.5°F | Resp 16 | Ht 60.5 in | Wt 162.6 lb

## 2023-09-09 DIAGNOSIS — Z789 Other specified health status: Secondary | ICD-10-CM | POA: Diagnosis not present

## 2023-09-09 DIAGNOSIS — Z5986 Financial insecurity: Secondary | ICD-10-CM

## 2023-09-09 DIAGNOSIS — I10 Essential (primary) hypertension: Secondary | ICD-10-CM

## 2023-09-09 DIAGNOSIS — Z23 Encounter for immunization: Secondary | ICD-10-CM | POA: Diagnosis not present

## 2023-09-09 DIAGNOSIS — R0789 Other chest pain: Secondary | ICD-10-CM | POA: Diagnosis not present

## 2023-09-09 DIAGNOSIS — Z5941 Food insecurity: Secondary | ICD-10-CM | POA: Diagnosis not present

## 2023-09-09 DIAGNOSIS — J029 Acute pharyngitis, unspecified: Secondary | ICD-10-CM

## 2023-09-09 DIAGNOSIS — B349 Viral infection, unspecified: Secondary | ICD-10-CM

## 2023-09-09 MED ORDER — LOSARTAN POTASSIUM 50 MG PO TABS: 50.0000 mg | ORAL_TABLET | Freq: Every day | ORAL | 1 refills | Status: DC

## 2023-09-09 MED ORDER — IBUPROFEN 600 MG PO TABS: 600.0000 mg | ORAL_TABLET | Freq: Three times a day (TID) | ORAL | 1 refills | Status: AC | PRN

## 2023-09-09 NOTE — Progress Notes (Unsigned)
Established Patient Office Visit  Subjective    Patient ID: Haley Griffin, female    DOB: [birth year]  Age: 38 y.o. MRN: [id number]  CC:  Chief Complaint  Patient presents with   Follow-up    6 month, left leg numbness, .chest pain, SHOB, medication refill    HPI Haley Griffin presents for routine follow up of hypertension. She reports that she had run out of meds and has not taken them in a couple of weeks or so. She also reports some viral sx and sore throat. This visit was aided by an interpreter.   Outpatient Encounter Medications as of 09/09/2023  Medication Sig   clobetasol ointment (TEMOVATE) 0.05 % APPLY TOPICALLY TO THE AFFECTED AREA TWICE DAILY   Emollient (EUCERIN) lotion Apply topically as needed for dry skin.   losartan (COZAAR) 50 MG tablet Take 1 tablet (50 mg total) by mouth daily.   [DISCONTINUED] ibuprofen (ADVIL) 600 MG tablet Take 1 tablet (600 mg total) by mouth every 8 (eight) hours as needed.   [DISCONTINUED] losartan (COZAAR) 50 MG tablet Take 1 tablet (50 mg total) by mouth daily.   ibuprofen (ADVIL) 600 MG tablet Take 1 tablet (600 mg total) by mouth every 8 (eight) hours as needed.   losartan (COZAAR) 50 MG tablet Take 1 tablet (50 mg total) by mouth daily.   No facility-administered encounter medications on file as of 09/09/2023.    Past Medical History:  Diagnosis Date   Gestational diabetes    History of gestational diabetes 11/19/2016   Borderline results on early 3hr OGTT> Saw dietician and checking CBGs   History of preterm delivery 12/16/2016   Hypertension     Past Surgical History:  Procedure Laterality Date   CESAREAN SECTION     CESAREAN SECTION MULTI-GESTATIONAL N/A 12/19/2016   Procedure: CESAREAN SECTION MULTI-GESTATIONAL;  Surgeon: [place] Bing, MD;  [facility];  Service: Obstetrics;  Laterality: N/A;    Family History  Problem Relation Age of Onset   Diabetes Mother     Social History   Socioeconomic History    Marital status: Married    Spouse name: Not on file   Number of children: Not on file   Years of education: Not on file   Highest education level: Not on file  Occupational History   Not on file  Tobacco Use   Smoking status: Never    Passive exposure: Never   Smokeless tobacco: Never  Vaping Use   Vaping status: Never Used  Substance and Sexual Activity   Alcohol use: No   Drug use: No   Sexual activity: Yes    Birth control/protection: Implant  Other Topics Concern   Not on file  Social History Narrative   Not on file   Social Drivers of Health   Financial Resource Strain: Medium Risk (09/09/2023)   Overall Financial Resource Strain (CARDIA)    Difficulty of Paying Living Expenses: Somewhat hard  Food Insecurity: No Food Insecurity (09/09/2023)   Hunger Vital Sign    Worried About Running Out of Food in the Last Year: Never true    Ran Out of Food in the Last Year: Never true  Transportation Needs: No Transportation Needs (09/09/2023)   PRAPARE - Administrator, Civil Service (Medical): No    Lack of Transportation (Non-Medical): No  Physical Activity: Insufficiently Active (09/09/2023)   Exercise Vital Sign    Days of Exercise per Week: 3 days  Minutes of Exercise per Session: 30 min  Stress: No Stress Concern Present (09/09/2023)   [employer] of Occupational Health - Occupational Stress Questionnaire    Feeling of Stress : Not at all  Social Connections: Moderately Integrated (09/09/2023)   Social Connection and Isolation Panel [NHANES]    Frequency of Communication with Friends and Family: Three times a week    Frequency of Social Gatherings with Friends and Family: Once a week    Attends Religious Services: 1 to 4 times per year    Active Member of [employer] or Organizations: No    Attends Banker Meetings: Never    Marital Status: Married  Catering manager Violence: Not At Risk (09/09/2023)   Humiliation, Afraid, Rape, and Kick  questionnaire    Fear of Current or Ex-Partner: No    Emotionally Abused: No    Physically Abused: No    Sexually Abused: No    Review of Systems  Respiratory:  Negative for shortness of breath.   Cardiovascular:  Positive for chest pain.  All other systems reviewed and are negative.       Objective    BP (!) 141/101 (BP Location: Right Arm, Patient Position: Sitting, Cuff Size: Normal)   Pulse 97   Temp 98.5 F (36.9 C) (Oral)   Resp 16   Ht 5' 0.5" (1.537 m)   Wt 162 lb 9.6 oz (73.8 kg)   SpO2 98%   BMI 31.23 kg/m   Physical Exam Vitals and nursing note reviewed.  Constitutional:      General: She is not in acute distress. Cardiovascular:     Rate and Rhythm: Normal rate and regular rhythm.  Pulmonary:     Effort: Pulmonary effort is normal.     Breath sounds: Normal breath sounds.  Abdominal:     Palpations: Abdomen is soft.     Tenderness: There is no abdominal tenderness.  Neurological:     General: No focal deficit present.     Mental Status: She is alert and oriented to person, place, and time.         Assessment & Plan:   1. Essential hypertension (Primary) Patient with elevated readings. Discussed compliance. Meds refilled. Continue  2. Atypical chest pain Will monitor  3. Viral syndrome  - ibuprofen (ADVIL) 600 MG tablet; Take 1 tablet (600 mg total) by mouth every 8 (eight) hours as needed.  Dispense: 60 tablet; Refill: 1  4. Encounter for immunization  - Flu vaccine trivalent PF, 6mos and older(Flulaval,Afluria,Fluarix,Fluzone)  5. Language barrier to communication   Return in about 4 weeks (around 10/07/2023) for follow up, chronic med issues.   Tommie Raymond, MD

## 2023-09-10 ENCOUNTER — Encounter: Payer: Self-pay | Admitting: Family Medicine

## 2023-10-05 ENCOUNTER — Ambulatory Visit: Payer: Medicaid Other | Admitting: Family Medicine

## 2024-03-24 ENCOUNTER — Other Ambulatory Visit: Payer: Self-pay | Admitting: Family Medicine

## 2024-05-12 ENCOUNTER — Other Ambulatory Visit (HOSPITAL_COMMUNITY)
Admission: RE | Admit: 2024-05-12 | Discharge: 2024-05-12 | Disposition: A | Discharge status: Home / Self Care | Source: Ambulatory Visit | Attending: Obstetrics and Gynecology | Admitting: Obstetrics and Gynecology

## 2024-05-12 ENCOUNTER — Encounter: Payer: Self-pay | Admitting: Obstetrics and Gynecology

## 2024-05-12 ENCOUNTER — Ambulatory Visit: Admitting: Obstetrics and Gynecology

## 2024-05-12 VITALS — BP 121/91 | HR 95 | Ht 60.0 in | Wt 159.2 lb

## 2024-05-12 DIAGNOSIS — Z01419 Encounter for gynecological examination (general) (routine) without abnormal findings: Secondary | ICD-10-CM | POA: Diagnosis not present

## 2024-05-12 DIAGNOSIS — Z124 Encounter for screening for malignant neoplasm of cervix: Secondary | ICD-10-CM

## 2024-05-12 DIAGNOSIS — Z113 Encounter for screening for infections with a predominantly sexual mode of transmission: Secondary | ICD-10-CM | POA: Diagnosis not present

## 2024-05-12 NOTE — Progress Notes (Signed)
 GYNECOLOGY ANNUAL PREVENTATIVE CARE ENCOUNTER NOTE  History:     Haley Griffin is a 38 y.o. [phone] female here for a routine annual gynecologic exam.  Current complaints: concerned about possible weight gain with nexplanon .   Denies abnormal vaginal bleeding, discharge, pelvic pain, problems with intercourse or other gynecologic concerns.    Gynecologic History No LMP recorded. Patient has had an implant. Contraception: Nexplanon  Last Pap: 04/09/20. Results were: normal with negative HPV Last mammogram: n/a  Obstetric History OB History  Gravida Para Term Preterm AB Living  4 4 3 1  4   SAB IAB Ectopic Multiple Live Births     1 5    # Outcome Date GA Lbr Len/2nd Weight Sex Type Anes PTL Lv  4 Term 08/18/18 [redacted]w[redacted]d 00:36 / 00:06 6 lb 5.2 oz (2.869 kg) F VBAC None  LIV  3A Preterm 12/19/16 [redacted]w[redacted]d  1 lb 6.2 oz (0.63 kg) F CS-LTranv Spinal  LIV     Complications: Preterm premature rupture of membranes (PPROM) with onset of labor after 24 hours of rupture in second trimester, antepartum, Breech birth, fetus 1  3B Preterm 12/19/16 [redacted]w[redacted]d  1 lb 6.9 oz (0.65 kg) F CS-LTranv Spinal Y ND     Birth Comments: Died at 8 days old     Complications: Preterm premature rupture of membranes (PPROM) with onset of labor after 24 hours of rupture in second trimester, antepartum  2 Term 08/06/09 [redacted]w[redacted]d   M Vag-Spont None N LIV  1 Term 08/22/03    M Vag-Spont None N LIV    Past Medical History:  Diagnosis Date   Gestational diabetes    History of gestational diabetes 11/19/2016   Borderline results on early 3hr OGTT> Saw dietician and checking CBGs   History of preterm delivery 12/16/2016   Hypertension     Past Surgical History:  Procedure Laterality Date   CESAREAN SECTION     CESAREAN SECTION MULTI-GESTATIONAL N/A 12/19/2016   Procedure: CESAREAN SECTION MULTI-GESTATIONAL;  Surgeon: Izell Harari, MD;  [facility];  Service: Obstetrics;  Laterality: N/A;    Current Outpatient  Medications on File Prior to Visit  Medication Sig Dispense Refill   clobetasol  ointment (TEMOVATE ) 0.05 % APPLY TOPICALLY TO THE AFFECTED AREA TWICE DAILY 30 g 0   Emollient (EUCERIN) lotion Apply topically as needed for dry skin. 240 mL 0   ibuprofen  (ADVIL ) 600 MG tablet Take 1 tablet (600 mg total) by mouth every 8 (eight) hours as needed. 60 tablet 1   losartan  (COZAAR ) 50 MG tablet Take 1 tablet (50 mg total) by mouth daily. 90 tablet 1   losartan  (COZAAR ) 50 MG tablet TAKE 1 TABLET(50 MG) BY MOUTH DAILY 90 tablet 1   No current facility-administered medications on file prior to visit.    No Known Allergies  Social History:  reports that she has never smoked. She has never been exposed to tobacco smoke. She has never used smokeless tobacco. She reports that she does not drink alcohol and does not use drugs.  Family History  Problem Relation Age of Onset   Diabetes Mother     The following portions of the patient's history were reviewed and updated as appropriate: allergies, current medications, past family history, past medical history, past social history, past surgical history and problem list.  Review of Systems Pertinent items noted in HPI and remainder of comprehensive ROS otherwise negative.  Physical Exam:  BP (!) 121/91   Pulse 95  Ht 5' (1.524 m)   Wt 159 lb 3.2 oz (72.2 kg)   BMI 31.09 kg/m  CONSTITUTIONAL: Well-developed, well-nourished female in no acute distress.  HENT:  Normocephalic, atraumatic, External right and left ear normal. Oropharynx is clear and moist EYES: Conjunctivae and EOM are normal.  NECK: Normal range of motion, supple, no masses.  Normal thyroid.  SKIN: Skin is warm and dry. No rash noted. Not diaphoretic. No erythema. No pallor. MUSCULOSKELETAL: Normal range of motion. No tenderness.  No cyanosis, clubbing, or edema.  2+ distal pulses. NEUROLOGIC: Alert and oriented to person, place, and time. Normal reflexes, muscle tone coordination.   PSYCHIATRIC: Normal mood and affect. Normal behavior. Normal judgment and thought content. CARDIOVASCULAR: Normal heart rate noted, regular rhythm RESPIRATORY: Clear to auscultation bilaterally. Effort and breath sounds normal, no problems with respiration noted. BREASTS: deferred ABDOMEN: Soft, no distention noted.  No tenderness, rebound or guarding.  PELVIC: Normal appearing external genitalia and urethral meatus; normal appearing vaginal mucosa and cervix.  No abnormal discharge noted.  Pap smear obtained. Vaginal swabobtained.  Normal uterine size, no other palpable masses, no uterine or adnexal tenderness.  Light menstrual bleeding noted.  Performed in the presence of a chaperone.   Assessment and Plan:    1. Cervical cancer screening (Primary)  - Cytology - PAP( [place])  2. Screening for STD (sexually transmitted disease) Per pt request - Cytology - PAP( [place]) - HIV Antibody (routine testing w rflx) - [facility] - Hepatitis B Surface AntiGEN - Hepatitis C Antibody  3. Women's annual routine gynecological examination Normal annual exam Discussed options.  Pt wanted to switch to depo-provera, explained that medication may have more influence on weight gain.  Another nexplanon  or possibly an IUD may be a better choice for contraception and cycle control  Will follow up results of pap smear and manage accordingly. Routine preventative health maintenance measures emphasized. Please refer to After Visit Summary for other counseling recommendations.      Jerilynn Buddle, MD, FACOG Obstetrician & Gynecologist, [facility] for [facility], [facility]

## 2024-05-13 LAB — CYTOLOGY - PAP
Chlamydia: NEGATIVE
Comment: NEGATIVE
Comment: NEGATIVE
Comment: NEGATIVE
Comment: NORMAL
Diagnosis: NEGATIVE
High risk HPV: NEGATIVE
Neisseria Gonorrhea: NEGATIVE
Trichomonas: NEGATIVE

## 2024-05-13 LAB — RPR: RPR Ser Ql: NONREACTIVE

## 2024-05-13 LAB — HEPATITIS C ANTIBODY: Hep C Virus Ab: NONREACTIVE

## 2024-05-13 LAB — HIV ANTIBODY (ROUTINE TESTING W REFLEX): HIV Screen 4th Generation wRfx: NONREACTIVE

## 2024-05-13 LAB — HEPATITIS B SURFACE ANTIGEN: Hepatitis B Surface Ag: NEGATIVE

## 2024-05-15 ENCOUNTER — Ambulatory Visit: Payer: Self-pay | Admitting: Obstetrics and Gynecology
